# Patient Record
Sex: Female | Born: 1960 | Race: White | Hispanic: No | Marital: Married | Smoking: Current every day smoker
Health system: Southern US, Community
[De-identification: ages and names within clinical notes are randomized; demographics above are authoritative.]

## PROBLEM LIST (undated history)

## (undated) DIAGNOSIS — R011 Cardiac murmur, unspecified: Secondary | ICD-10-CM

## (undated) DIAGNOSIS — E785 Hyperlipidemia, unspecified: Secondary | ICD-10-CM

## (undated) DIAGNOSIS — Z720 Tobacco use: Secondary | ICD-10-CM

## (undated) HISTORY — DX: Tobacco use: Z72.0

## (undated) HISTORY — DX: Hyperlipidemia, unspecified: E78.5

## (undated) HISTORY — DX: Cardiac murmur, unspecified: R01.1

## (undated) HISTORY — PX: COLONOSCOPY: SHX174

---

## 2001-08-19 ENCOUNTER — Encounter: Admission: RE | Admit: 2001-08-19 | Discharge: 2001-08-19 | Payer: Self-pay | Admitting: Family Medicine

## 2001-08-19 ENCOUNTER — Encounter: Payer: Self-pay | Admitting: Family Medicine

## 2003-10-21 ENCOUNTER — Other Ambulatory Visit: Admission: RE | Admit: 2003-10-21 | Discharge: 2003-10-21 | Payer: Self-pay | Admitting: Obstetrics and Gynecology

## 2004-12-21 ENCOUNTER — Other Ambulatory Visit: Admission: RE | Admit: 2004-12-21 | Discharge: 2004-12-21 | Payer: Self-pay | Admitting: Obstetrics and Gynecology

## 2006-01-10 ENCOUNTER — Other Ambulatory Visit: Admission: RE | Admit: 2006-01-10 | Discharge: 2006-01-10 | Payer: Self-pay | Admitting: Obstetrics and Gynecology

## 2009-07-11 ENCOUNTER — Emergency Department (HOSPITAL_COMMUNITY): Admission: EM | Admit: 2009-07-11 | Discharge: 2009-07-11 | Payer: Self-pay | Admitting: Emergency Medicine

## 2010-04-07 HISTORY — PX: CARDIOVASCULAR STRESS TEST: SHX262

## 2011-03-23 ENCOUNTER — Observation Stay (HOSPITAL_COMMUNITY)
Admission: EM | Admit: 2011-03-23 | Discharge: 2011-03-24 | DRG: 251 | Disposition: A | Discharge status: Home / Self Care | Payer: BC Managed Care – PPO | Attending: Orthopedic Surgery | Admitting: Orthopedic Surgery

## 2011-03-23 DIAGNOSIS — Z7982 Long term (current) use of aspirin: Secondary | ICD-10-CM | POA: Insufficient documentation

## 2011-03-23 DIAGNOSIS — S61409A Unspecified open wound of unspecified hand, initial encounter: Secondary | ICD-10-CM | POA: Insufficient documentation

## 2011-03-23 DIAGNOSIS — Z23 Encounter for immunization: Secondary | ICD-10-CM | POA: Insufficient documentation

## 2011-03-23 DIAGNOSIS — E7800 Pure hypercholesterolemia, unspecified: Secondary | ICD-10-CM | POA: Insufficient documentation

## 2011-03-23 DIAGNOSIS — W540XXA Bitten by dog, initial encounter: Secondary | ICD-10-CM | POA: Insufficient documentation

## 2011-03-23 DIAGNOSIS — Z79899 Other long term (current) drug therapy: Secondary | ICD-10-CM | POA: Insufficient documentation

## 2011-03-23 DIAGNOSIS — S62233B Other displaced fracture of base of first metacarpal bone, unspecified hand, initial encounter for open fracture: Principal | ICD-10-CM | POA: Insufficient documentation

## 2011-03-23 DIAGNOSIS — Y92009 Unspecified place in unspecified non-institutional (private) residence as the place of occurrence of the external cause: Secondary | ICD-10-CM | POA: Insufficient documentation

## 2011-03-23 DIAGNOSIS — F17200 Nicotine dependence, unspecified, uncomplicated: Secondary | ICD-10-CM | POA: Insufficient documentation

## 2011-03-24 ENCOUNTER — Emergency Department (HOSPITAL_COMMUNITY): Payer: BC Managed Care – PPO

## 2011-03-25 NOTE — Op Note (Signed)
Angelica [name], Angelica [name]               ACCOUNT NO.:  [id number]  MEDICAL RECORD NO.:  [id number]  LOCATION:  0098                         FACILITY:  [facility]  PHYSICIAN:  Betha Loa, MD        DATE OF BIRTH:  22-Sep-1960  DATE OF PROCEDURE:  03/24/2011 DATE OF DISCHARGE:                              OPERATIVE REPORT   PREOPERATIVE DIAGNOSIS:  Bilateral hand dog bites and left thumb open metacarpal fracture.  POSTOPERATIVE DIAGNOSES:  Bilateral hand dog bites and left thumb open metacarpal fracture.  PROCEDURE:  Irrigation and debridement of bilateral hand dog bites and open left thumb metacarpal fracture.  SURGEON:  Betha Loa, MD  ASSISTANT:  None.  ANESTHESIA:  General.  IV FLUIDS:  Per anesthesia flow sheet.  ESTIMATED BLOOD LOSS:  Minimal.  COMPLICATIONS:  None.  SPECIMENS:  None.  TOURNIQUET TIME:  28 minutes on the left, 9 minutes on the right.  DISPOSITION:  Stable to PACU.  INDICATIONS:  Angelica [name] is a 50 year old right-hand-dominant female, who was bitten by her own dog yesterday evening.  She came to the [facility] emergency department where she was evaluated.  Radiographs revealed a left thumb metacarpal fracture.  This was felt to be open, was consulted for management of any injury.  On examination, she had intact sensation of capillary refill in all fingertips.  She could flex and extend the IP joint of her thumbs and cross her fingers.  She had full extension, full flexion of all digits against resistance without pain.  She had multiple bite wounds at the base of the thumb metacarpal on the left hand and bite wound between the index and long finger on the dorsum of the right hand. Radiographs showed a fracture at the base of the left thumb metacarpal that was extraarticular.  No fractures, dislocations or radiopaque foreign bodies were noted on the right hand x-rays.  I discussed the same with the nature of her injuries.  We recommend going to  the operating room for irrigation and debridement of the wounds well with packing.  Her tetanus had been updated in the emergency department.  She had been given IV Unasyn.  Risks, benefits,alternatives of surgery were discussed including the risk of blood loss, infection, damage to nerves, vessels, tendons, ligaments, bone, failure of surgery, need for additional surgery, complications with wound healing, continued pain, continued infection, need for further surgery.  She voiced understanding of these risks and elected to proceed.  OPERATIVE COURSE:  After being identified preoperatively by myself, the patient and I agreed upon procedure and site of the procedure.  The surgical site was marked.  Risks, benefits, alternatives of surgery were reviewed and she wished to proceed.  Surgical consent had been signed.  She was transferred to operating table and placed on the operating table in supine position with both upper extremities on arm boards.  General anesthesia was induced by the anesthesiologist.  The left upper extremity was prepped and draped in the normal sterile orthopedic fashion.  A surgical pause was performed between surgeons, Anesthesia, operating staff and all were in agreement of the procedure and site of procedure.  Tourniquet proximal aspect of the extremities  inflated to 250 mmHg after exsanguination of limb with an Esmarch bandage .  The wounds were explored.  They were all extended to allow visualization. There was no gross contamination.  The wound at the base of the thumb metacarpal slightly volarly communicated with the fracture site.  All wounds were copiously irrigated with sterile saline.  2000 cc of sterile saline was used to irrigate the fracture site.  1000 cc was used in the other wounds.  A couple of superficial branches of the radial nerve were visualized and were intact.  The EPL was intact.  The radial wrist extensors were intact.  There was also a wound  at the ulnar side of the wrist.  This was also irrigated and debrided.  There is no gross contamination.  There was a piece of skin within the wound.  The wounds were all packed with quarter inch Iodoform gauze.  They were then dressed with sterile Xeroform with 4x4s, and wrapped with a Kerlix. Attention was turned to the tourniquet deflated at 20 minutes. Attention was turned to the right upper extremity which had been prepped and draped in normal sterile orthopedic fashion.  Tourniquet proximal aspect of the forearm was inflated to 250 mmHg after exsanguination of limb with an Esmarch bandage.  The wound was explored.  There was noted to be no damage to the extensor tendon of either the index or long finger.  There was no gross contamination.  1000 cc of sterile saline was used to irrigate the wound.  The wound was then packed with quarter inch Iodoform gauze.  It was dressed with sterile Xeroform and 4x4s and wrapped with Kerlix and Ace bandage.  A thumb spica splint was placed on the left hand and wrapped with Kerlix and Ace bandage.  Tourniquet on the right was deflated at 9 minutes.  All fingertips were pink with brisk capillary refill after deflation of the tourniquet.  The patient was awoken from anesthesia safely.  She was transferred back to stretcher and taken to PACU in stable condition.  We will keep her in the hospital for IV antibiotics for coverage of the dog bite wounds.  We will plan fixation of the fracture at a later date.     Betha Loa, MD     KK/MEDQ  D:  03/24/2011  T:  03/24/2011  Job:  [id number]  Electronically Signed by Betha Loa  on 03/25/2011 05:09:25 PM

## 2011-03-25 NOTE — Discharge Summary (Signed)
  [name], [name]               ACCOUNT NO.:  [id number]  MEDICAL RECORD NO.:  [id number]  LOCATION:  1424                         FACILITY:  [facility]  PHYSICIAN:  Betha Loa, MD        DATE OF BIRTH:  1961/06/17  DATE OF ADMISSION:  03/23/2011 DATE OF DISCHARGE:  03/24/2011                              DISCHARGE SUMMARY   FINAL DIAGNOSES:  Bilateral hand dog bites with left thumb metacarpal fracture.  PROCEDURES:  Irrigation and debridement bilateral hand dog bites.  HISTORY:  Ms. Angelica [name] is a 50 year old right-hand-dominant female who was bitten by her own dogs on the evening of September 7.  She presented to [facility] Emergency Department where she was evaluated.  Radiographs revealed a thumb metacarpal fracture.  I was consulted for management of the injury.  On examination, she had intact sensation, capillary refill in all fingertips.  She could flex and extend the IP joints of her thumbs and cross her fingers.  FDP and FDS were intact all fingers. She had good full extension of all digits without pain.  I recommended Ms. Angelica [name] go into the operating room for irrigation and debridement of the wounds and packing of the wounds.  Risks, benefits, and alternatives of the surgery were discussed and she wished to proceed.  HOSPITAL COURSE:  Ms. Angelica [name] was taken to the operating room in the early morning hours of September 8.  Irrigation and debridement of bilateral hands was performed.  There were no complications.  She was kept for IV antibiotics.  She did well.  Pain was well-controlled.  She had no fevers, chills or night sweats.  It was thought she was safe to discharge home in the late afternoon of September 8.  She was discharged home with follow up plans for Monday for hydrotherapy and follow up with me.  I will give her Percocet 5/325 1 to 2 p.o. q.6 h p.r.n. pain, dispensed #40, and Augmentin 875 mg 1 p.o. b.i.d. x7 days.     Betha Loa, MD     KK/MEDQ  D:   03/24/2011  T:  03/24/2011  Job:  [id number]  Electronically Signed by Betha Loa  on 03/25/2011 05:09:53 PM

## 2011-03-28 NOTE — H&P (Signed)
Angelica [name]               ACCOUNT NO.:  [id number]  MEDICAL RECORD NO.:  [id number]  LOCATION:                                 FACILITY:  PHYSICIAN:  Betha Loa, MD        DATE OF BIRTH:  [birth year]  DATE OF ADMISSION:  03/24/2011 DATE OF DISCHARGE:                             HISTORY & PHYSICAL   CHIEF COMPLAINTS:  Dog bite, bilateral hands.  HISTORY OF PRESENT ILLNESS:  Angelica [name] is a 50 year old right hand dominant female who was bitten by her Rottweiler pit mixed in the late evening of September 8 when the dog started fighting with her other dog over the dog bull.  The dogs were not attacking her, but were fighting with each other.  She was bitten on both hands.  She came to [facility] Emergency Department where she was evaluated.  I was consulted for management of injury.  She reports no previous injuries to the hands and no other injuries at this time.  ALLERGIES:  No known drug allergies.  PAST MEDICAL HISTORY:  Hypercholesterolemia.  PAST SURGICAL HISTORY:  None.  MEDICATIONS:  Tricor, Lipitor, aspirin, and fish oil.  FAMILY HISTORY:  Positive for diabetes.  SOCIAL HISTORY:  Angelica [name] works at [employer].  She smokes one half-pack per day x20 plus years and drinks up to three or four beers per day.  REVIEW OF SYSTEMS:  Thirteen-point review of systems is negative.  PHYSICAL EXAMINATION:  VITAL SIGNS:  Temperature 97.6, pulse 96, respirations 18, and BP 128/82. HEAD:  Normocephalic, atraumatic. NECK:  Supple.  Full range of motion. CHEST:  Regular rate and rhythm. LUNGS:  Clear to auscultation bilaterally. ABDOMEN:  Nontender, nondistended. EXTREMITIES:  Bilateral upper extremities are intact to light touch sensation and capillary refill in all fingertips.  She can flex and extend the IP joints of her thumbs, and can cross her fingers.  In the right upper extremity, she has a wound at the dorsum of the hand between the index and long finger.  There is  a little bit of bruising in the proximal phalanx of long finger.  This is approximately 2 cm in length. She has good extension against resistance at the index, long, and ring fingers.  There is no pain when doing so.  In the left hand.  There are multiple bite wounds at the base of the thumb metacarpal and one at the ulnar side of the wrist.  She has intact sensation.  Capillary refill throughout the hand and in the palm and the hand.  FDP and FDS are intact to all digits.  She can flex and extend the IP joints of her thumb.  RADIOGRAPHS:  AP, lateral, and oblique views of left hand show an extraarticular metacarpal base fracture of the thumb.  There is some displacement.  ASSESSMENT/PLAN:  Bilateral hand dog bites and left thumb metacarpal base fracture.  I discussed with Angelica [name], the nature of her injury.  I think this is likely open fracture caused by the dog bite.  I would recommend to go into the operating room for irrigation debridement of the bite wounds.  We discussed that we would  perform fixation of the thumb metacarpal at a later day after all signs of potential infection had cleared.  We would irrigate and debride the wounds, possibly extend them and pack them open.  Risks, benefits, and alternative to the surgery were discussed including risk of blood loss; infection; damage to nerves, vessels, tendons, ligaments, and bone; failure of surgery; need for additional surgery; complications with wound healing; continued pain; continued infection; need for repeat irrigation and debridement. She voiced understanding of these risks and elected to proceed.  This will be arranged as soon as possible.     Betha Loa, MD     KK/MEDQ  D:  03/24/2011  T:  03/24/2011  Job:  [id number]  Electronically Signed by Betha Loa  on 03/28/2011 02:27:57 PM

## 2011-11-06 HISTORY — PX: TRANSTHORACIC ECHOCARDIOGRAM: SHX275

## 2012-12-31 ENCOUNTER — Other Ambulatory Visit: Payer: Self-pay | Admitting: Cardiovascular Disease

## 2012-12-31 LAB — CBC WITH DIFFERENTIAL/PLATELET
Basophils Absolute: 0 10*3/uL (ref 0.0–0.1)
Basophils Relative: 0 % (ref 0–1)
Eosinophils Absolute: 0.3 10*3/uL (ref 0.0–0.7)
Eosinophils Relative: 3 % (ref 0–5)
HCT: 44.1 % (ref 36.0–46.0)
Hemoglobin: 15.2 g/dL — ABNORMAL HIGH (ref 12.0–15.0)
Lymphocytes Relative: 45 % (ref 12–46)
Lymphs Abs: 4.5 10*3/uL — ABNORMAL HIGH (ref 0.7–4.0)
MCH: 32.5 pg (ref 26.0–34.0)
MCHC: 34.5 g/dL (ref 30.0–36.0)
MCV: 94.2 fL (ref 78.0–100.0)
Monocytes Absolute: 0.6 10*3/uL (ref 0.1–1.0)
Monocytes Relative: 6 % (ref 3–12)
Neutro Abs: 4.6 10*3/uL (ref 1.7–7.7)
Neutrophils Relative %: 46 % (ref 43–77)
Platelets: 275 10*3/uL (ref 150–400)
RBC: 4.68 MIL/uL (ref 3.87–5.11)
RDW: 13 % (ref 11.5–15.5)
WBC: 9.9 10*3/uL (ref 4.0–10.5)

## 2012-12-31 LAB — LIPID PANEL
Cholesterol: 193 mg/dL (ref 0–200)
HDL: 55 mg/dL (ref >39)
LDL Cholesterol: 84 mg/dL (ref 0–99)
Total CHOL/HDL Ratio: 3.5 Ratio
Triglycerides: 268 mg/dL — ABNORMAL HIGH (ref <150)
VLDL: 54 mg/dL — ABNORMAL HIGH (ref 0–40)

## 2012-12-31 LAB — COMPREHENSIVE METABOLIC PANEL
ALT: 21 U/L (ref 0–35)
AST: 24 U/L (ref 0–37)
Albumin: 4.3 g/dL (ref 3.5–5.2)
Alkaline Phosphatase: 69 U/L (ref 39–117)
BUN: 12 mg/dL (ref 6–23)
CO2: 23 mEq/L (ref 19–32)
Calcium: 9.4 mg/dL (ref 8.4–10.5)
Chloride: 105 mEq/L (ref 96–112)
Creat: 0.56 mg/dL (ref 0.50–1.10)
Glucose, Bld: 83 mg/dL (ref 70–99)
Potassium: 4.2 mEq/L (ref 3.5–5.3)
Sodium: 140 mEq/L (ref 135–145)
Total Bilirubin: 0.6 mg/dL (ref 0.3–1.2)
Total Protein: 6.9 g/dL (ref 6.0–8.3)

## 2012-12-31 LAB — TSH: TSH: 3.102 u[IU]/mL (ref 0.350–4.500)

## 2013-01-02 ENCOUNTER — Encounter: Payer: Self-pay | Admitting: Cardiovascular Disease

## 2013-01-19 ENCOUNTER — Telehealth: Payer: Self-pay | Admitting: *Deleted

## 2013-01-19 NOTE — Telephone Encounter (Signed)
Attempt to call lab results - no answer [phone]

## 2013-04-24 IMAGING — CR DG HAND COMPLETE 3+V*R*
3 series · 3 of 3 positions shown · non-contrast
Comparison: None.

CLINICAL DATA: Right hand injury; dog bite, with right middle
finger bruising and swelling.

RIGHT HAND - COMPLETE 3+ VIEW

[x hand pa right]
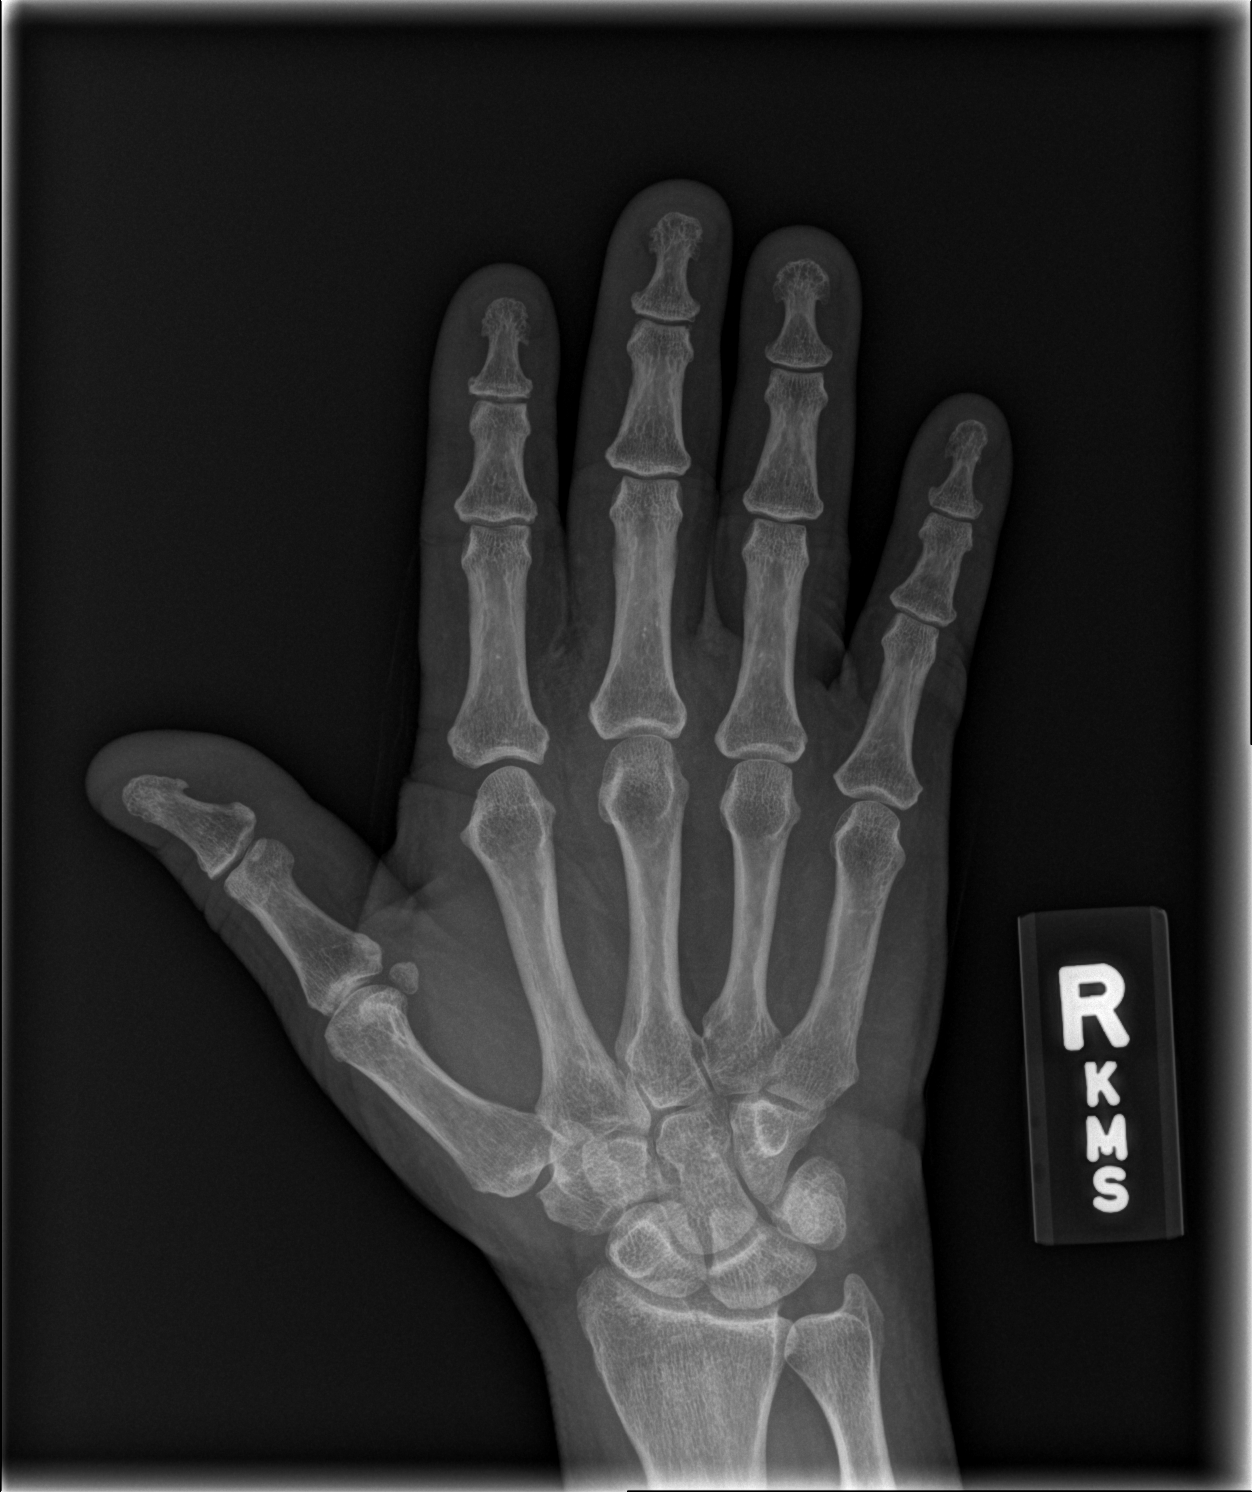

[x hand obl right]
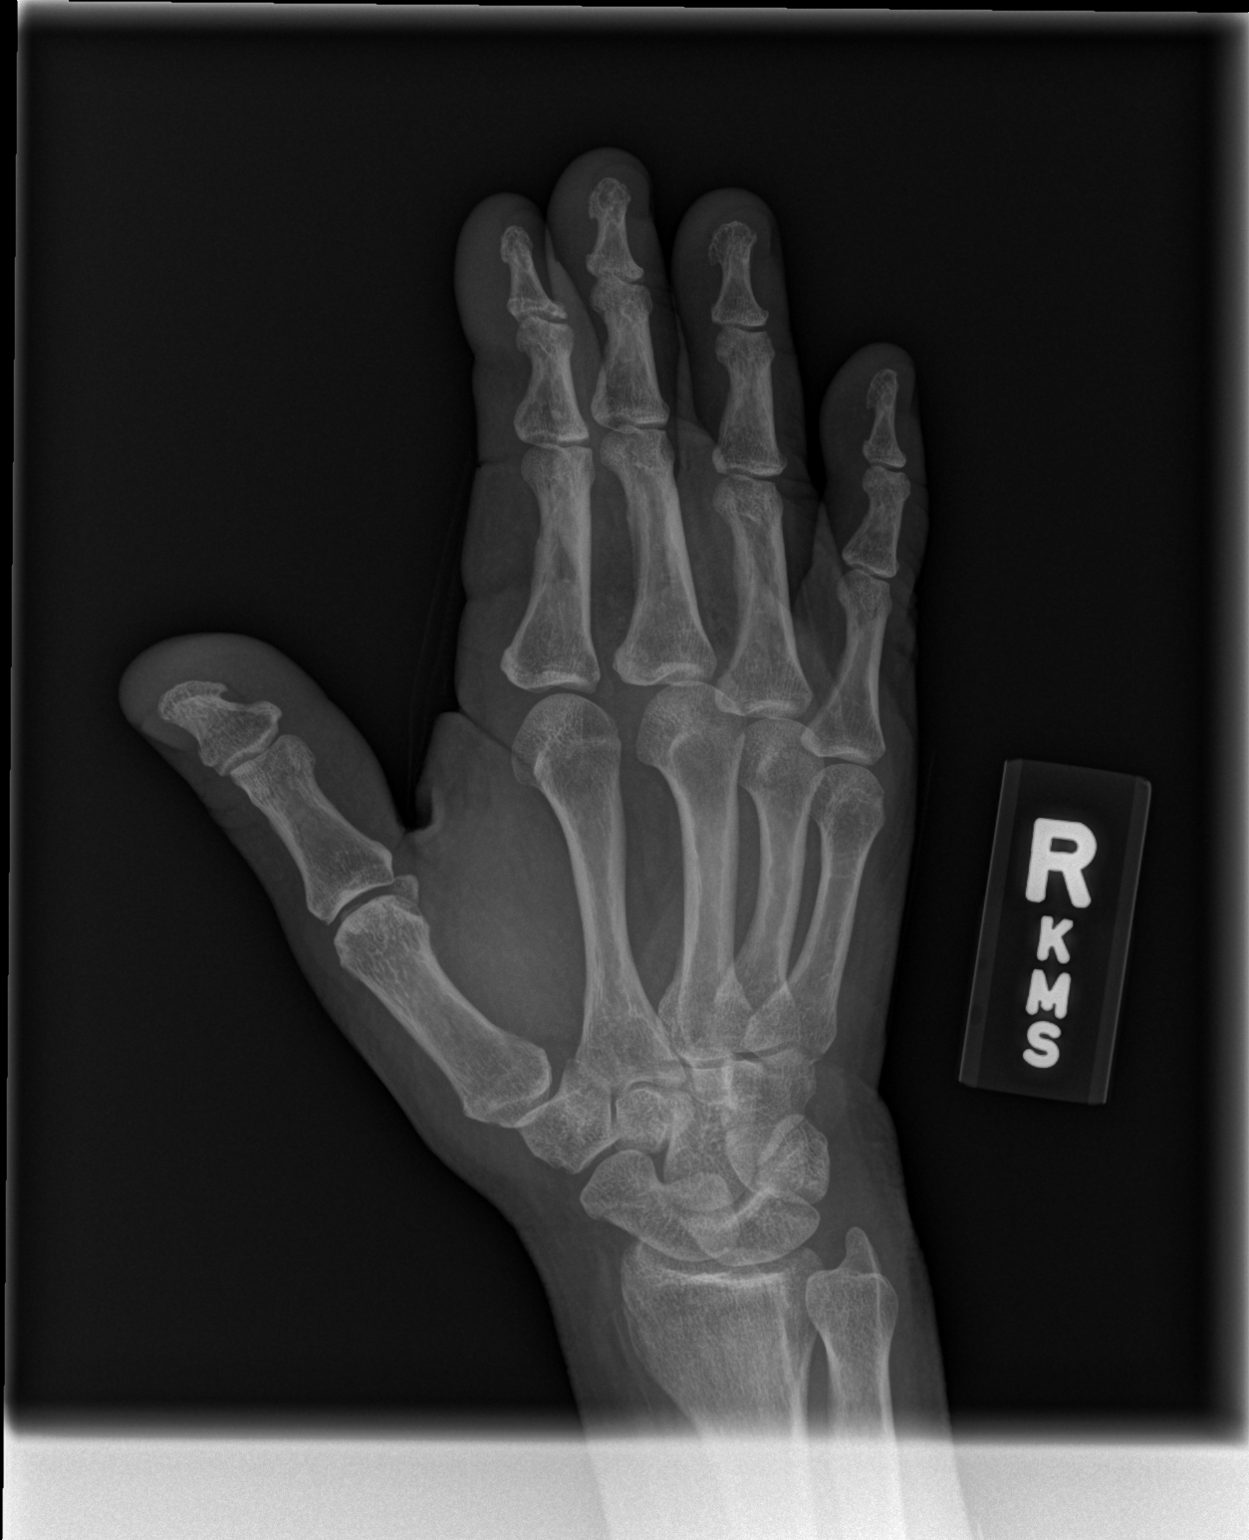

[x hand lat right]
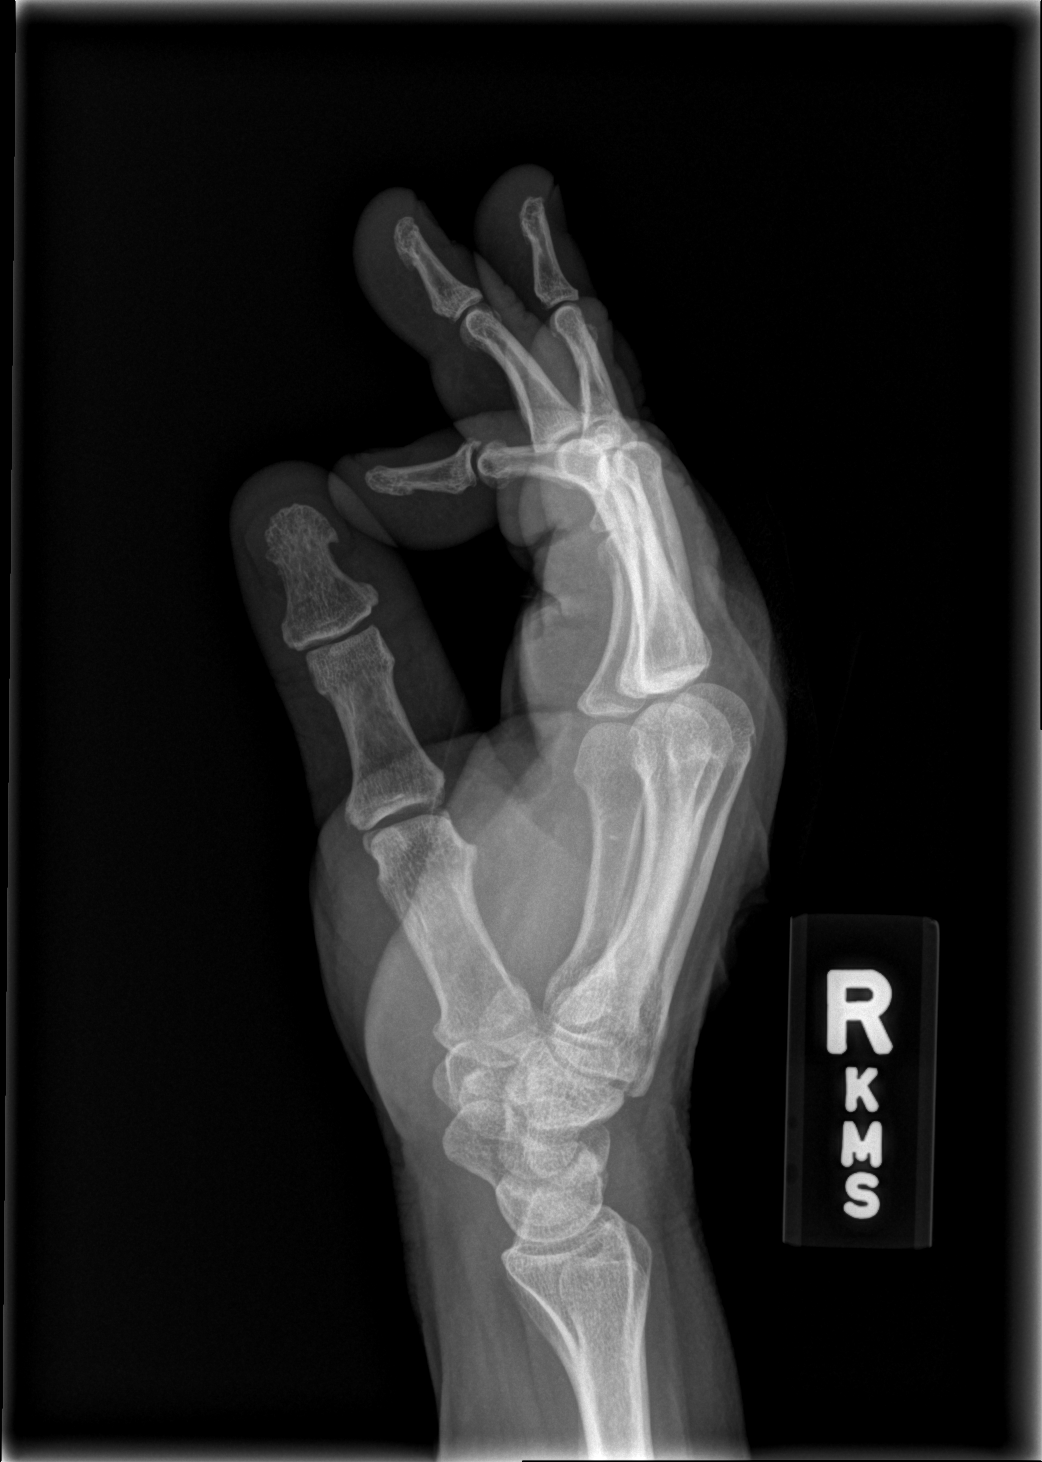

[3 of 3 positions shown; findings below may reference images not displayed]

FINDINGS: There is no evidence of fracture or dislocation.  No
definite radiopaque foreign bodies are identified.  The joint
spaces are preserved; the known soft tissue laceration is not well
characterized on radiograph.  The carpal rows are intact, and
demonstrate normal alignment.
IMPRESSION: No evidence of fracture or dislocation; no radiopaque foreign body
seen.

## 2013-05-08 ENCOUNTER — Ambulatory Visit: Payer: BC Managed Care – PPO | Admitting: Cardiovascular Disease

## 2013-05-21 ENCOUNTER — Ambulatory Visit: Payer: BC Managed Care – PPO | Admitting: Cardiovascular Disease

## 2013-06-24 ENCOUNTER — Ambulatory Visit: Payer: BC Managed Care – PPO | Admitting: Cardiovascular Disease

## 2013-07-21 ENCOUNTER — Ambulatory Visit: Payer: BC Managed Care – PPO | Admitting: Cardiovascular Disease

## 2013-08-11 ENCOUNTER — Ambulatory Visit (INDEPENDENT_AMBULATORY_CARE_PROVIDER_SITE_OTHER): Payer: BC Managed Care – PPO | Admitting: Cardiovascular Disease

## 2013-08-11 ENCOUNTER — Encounter: Payer: Self-pay | Admitting: Cardiovascular Disease

## 2013-08-11 VITALS — BP 130/68 | HR 77 | Ht 59.0 in | Wt 151.0 lb

## 2013-08-11 DIAGNOSIS — I10 Essential (primary) hypertension: Secondary | ICD-10-CM

## 2013-08-11 MED ORDER — FENOFIBRATE 48 MG PO TABS: 48.0000 mg | ORAL_TABLET | Freq: Every day | ORAL | Status: DC

## 2013-08-11 MED ORDER — ATORVASTATIN CALCIUM 20 MG PO TABS: 20.0000 mg | ORAL_TABLET | Freq: Every day | ORAL | Status: DC

## 2013-08-11 MED ORDER — METOPROLOL SUCCINATE ER 25 MG PO TB24: 25.0000 mg | ORAL_TABLET | Freq: Every day | ORAL | Status: DC

## 2013-08-11 NOTE — Progress Notes (Signed)
Patient ID: Angelica [name], female   DOB: 04/12/1961, 53 y.o.   MRN: [id number]  Chief Complaint  Patient presents with  . Follow-up    rov 6 months, RW -> JB, no complaints    HPI Angelica [name] is a 53 y.o. female who presents for routine follow up of HTN, hyperlipidemia, obesity, strong family history of coronary disease (father dying after CABG in his 3050's). She used to be followed by Dr. Alanda AmassWeintraub.  She reports feeling well. She denies any shortness of breath, chest pain, palpitations or lower extremity swelling. No dizziness or fatigue. She continues to work at [employer] where she is on her feet all day. She works 3rd shift and states that she has not had time to exercise. She continues to smoke 1/2 pack per day. She has smoked since the age of [age]yo. She is compliant with her medications.   She had a myoview stress test in September 2011 which was low risk. She has not required catheterization.   Past Medical History  Diagnosis Date  . Murmur   . Hyperlipidemia   . Tobacco use     Past Surgical History  Procedure Laterality Date  . Cardiovascular stress test  04/07/2010    EKG negative for ischemia. No significant ischemia demonstrated.  . Transthoracic echocardiogram  11/06/2011    EF >55%, normal    Family History: father with h/o CABG in his 1350's.   Social History History  Substance Use Topics  . Smoking status: Current Every Day Smoker -- 0.50 packs/day    Types: Cigarettes  . Smokeless tobacco: Not on file  . Alcohol Use: Not on file    No Known Allergies  Current Outpatient Prescriptions  Medication Sig Dispense Refill  . aspirin EC 81 MG tablet Take 81 mg by mouth daily.      Marland Kitchen. atorvastatin (LIPITOR) 20 MG tablet Take 20 mg by mouth daily.      . fenofibrate (TRICOR) 48 MG tablet Take 48 mg by mouth daily.      . fish oil-omega-3 fatty acids 1000 MG capsule Take 2 g by mouth daily.      . metoprolol succinate (TOPROL-XL) 25 MG 24 hr tablet Take 25 mg by  mouth daily.       No current facility-administered medications for this visit.    Review of Systems Review of Systems Negative except per HPI Blood pressure 130/68, pulse 77, height 4\' 11"  (1.499 m), weight 151 lb (68.493 kg).  Physical Exam Physical Exam General: no acute distress, well appearing, pleasant white female HEENT: EOMI, oropharynx clear, no JVD, no carotid bruit CV: S1S2, RRR, no murmur Pulm: clear to auscultation bilaterally, no wheezing or crackles, normal work of breathing Abdomen: soft, non tender, non distended, present bowel sounds Ext: no edema, 2+ dorsalis pedis pulse bilaterally, 2+ radial pulse bilaterally  Data Reviewed  Lipid panel: 12/2012 Chol: 193 Trig: 268 LDL: 84 HDL: 55  EKG: normal sinus rhythm, rate of 77bpm No ST changes, normal QRS, PR interval and QTc  Assessment    53 yo female with h/o HTN, hyperlipidemia, obesity, smoking and strong family history of coronary artery disease who presents for follow up.      Plan    1. Family History of coronary disease: normal stress test in 2011. Patient is currently asymptomatic.  - strongly reviewed importance of smoking cessation given her risk factors. Patient understands the importance of it but doesn't appear completely ready to quit.  - continue  statin, ASA and metoprolol succinate - recommended routine exercise routine  2. Hyperlipidemia: at goal on last check in June.  - continue atorvastatin 20mg , fenofibrate and fish oil 3. Smoking: as above, stressed importance of quitting 4. Hypertension: well controled on metoprolol succinate 25  Follow up with Dr. Allyson Sabal in 1 year.          Marena Chancy 08/11/2013, 9:09 AM  I agree with the assessment and plan by Dr. Gwenlyn Saran . Patient with positive crepitus factors. She continues to smoke despite recommendations to the contrary. She has a strong family history of heart disease and hyperlipidemia on lipid-lowering agents. She is otherwise  asymptomatic. Her exam is benign. I will see her back in one year.  Runell Gess, M.D., FACP, [facility], [facility] [address] [place], [place]  [address]  [phone] 08/11/2013 9:43 AM

## 2013-08-11 NOTE — Patient Instructions (Signed)
Your physician recommends that you schedule a follow-up appointment in: 1 year  

## 2013-10-07 ENCOUNTER — Ambulatory Visit (INDEPENDENT_AMBULATORY_CARE_PROVIDER_SITE_OTHER): Payer: BC Managed Care – PPO | Admitting: Emergency Medicine

## 2013-10-07 VITALS — BP 120/78 | HR 109 | Temp 97.7°F | Resp 18 | Ht <= 58 in | Wt 146.4 lb

## 2013-10-07 DIAGNOSIS — A088 Other specified intestinal infections: Secondary | ICD-10-CM

## 2013-10-07 MED ORDER — ONDANSETRON 8 MG PO TBDP: 8.0000 mg | ORAL_TABLET | Freq: Three times a day (TID) | ORAL | Status: DC | PRN

## 2013-10-07 MED ORDER — LOPERAMIDE HCL 2 MG PO TABS: ORAL_TABLET | ORAL | Status: DC

## 2013-10-07 NOTE — Patient Instructions (Signed)
Diet The clear liquid diet consists of foods that are liquid or will become liquid at room temperature. Examples of foods allowed on a clear liquid diet include fruit juice, broth or bouillon, gelatin, or frozen ice pops. You should be able to see through the liquid. The purpose of this diet is to provide the necessary fluids, electrolytes (such as sodium and potassium), and energy to keep the body functioning during times when you are not able to consume a regular diet. A clear liquid diet should not be continued for long periods of time, as it is not nutritionally adequate.  A CLEAR LIQUID DIET MAY BE NEEDED:  When a sudden-onset (acute) condition occurs before or after surgery.   As the first step in oral feeding.   For fluid and electrolyte replacement in diarrheal diseases.   As a diet before certain medical tests are performed.  ADEQUACY The clear liquid diet is adequate only in ascorbic acid, according to the Recommended Dietary Allowances of the National Research Council.  CHOOSING FOODS Breads and Starches  Allowed: None are allowed.   Avoid: All are to be avoided.  Vegetables  Allowed: Strained vegetable juices.   Avoid: Any others.  Fruit  Allowed: Strained fruit juices and fruit drinks. Include 1 serving of citrus or vitamin C-enriched fruit juice daily.   Avoid: Any others.  Meat and Meat Substitutes  Allowed: None are allowed.   Avoid: All are to be avoided.  Milk Products  Allowed: None are allowed.   Avoid: All are to be avoided.  Soups and Combination Foods  Allowed: Clear bouillon, broth, or strained broth-based soups.   Avoid: Any others.  Desserts and Sweets  Allowed: Sugar, honey. High-protein gelatin. Flavored gelatin, ices, or frozen ice pops that do not contain milk.   Avoid: Any others.  Fats and Oils  Allowed: None are allowed.   Avoid: All are to be avoided.  Beverages  Allowed: Cereal  beverages, coffee (regular or decaffeinated), tea, or soda at the discretion of your health care provider.   Avoid: Any others.  Condiments  Allowed: Salt.   Avoid: Any others, including pepper.  Supplements  Allowed: Liquid nutrition beverages that you can see through.   Avoid: Any others that contain lactose or fiber. SAMPLE MEAL PLAN Breakfast  4 oz (120 mL) strained orange juice.   to 1 cup (120 to 240 mL) gelatin (plain or fortified).  1 cup (240 mL) beverage (coffee or tea).  Sugar, if desired. Midmorning Snack   cup (120 mL) gelatin (plain or fortified). Lunch  1 cup (240 mL) broth or consomm.  4 oz (120 mL) strained grapefruit juice.   cup (120 mL) gelatin (plain or fortified).  1 cup (240 mL) beverage (coffee or tea).  Sugar, if desired. Midafternoon Snack   cup (120 mL) fruit ice.   cup (120 mL) strained fruit juice. Dinner  1 cup (240 mL) broth or consomm.   cup (120 mL) cranberry juice.   cup (120 mL) flavored gelatin (plain or fortified).  1 cup (240 mL) beverage (coffee or tea).  Sugar, if desired. Evening Snack  4 oz (120 mL) strained apple juice (vitamin C-fortified).   cup (120 mL) flavored gelatin (plain or fortified). MAKE SURE YOU:  Understand these instructions.  Will watch your child's condition.  Will get help right away if your child is not doing well or gets worse. Document Released: 07/02/2005 Document Revised: 03/04/2013 Document Reviewed: 12/02/2012 ExitCare Patient Information 2014 [employer]. Viral   Gastroenteritis Viral gastroenteritis is also known as stomach flu. This condition affects the stomach and intestinal tract. It can cause sudden diarrhea and vomiting. The illness typically lasts 3 to 8 days. Most people develop an immune response that eventually gets rid of the virus. While this natural response develops, the virus can make you quite ill. CAUSES  Many different viruses can cause  gastroenteritis, such as rotavirus or noroviruses. You can catch one of these viruses by consuming contaminated food or water. You may also catch a virus by sharing utensils or other personal items with an infected person or by touching a contaminated surface. SYMPTOMS  The most common symptoms are diarrhea and vomiting. These problems can cause a severe loss of body fluids (dehydration) and a body salt (electrolyte) imbalance. Other symptoms may include:  Fever.  Headache.  Fatigue.  Abdominal pain. DIAGNOSIS  Your caregiver can usually diagnose viral gastroenteritis based on your symptoms and a physical exam. A stool sample may also be taken to test for the presence of viruses or other infections. TREATMENT  This illness typically goes away on its own. Treatments are aimed at rehydration. The most serious cases of viral gastroenteritis involve vomiting so severely that you are not able to keep fluids down. In these cases, fluids must be given through an intravenous line (IV). HOME CARE INSTRUCTIONS   Drink enough fluids to keep your urine clear or pale yellow. Drink small amounts of fluids frequently and increase the amounts as tolerated.  Ask your caregiver for specific rehydration instructions.  Avoid:  Foods high in sugar.  Alcohol.  Carbonated drinks.  Tobacco.  Juice.  Caffeine drinks.  Extremely hot or cold fluids.  Fatty, greasy foods.  Too much intake of anything at one time.  Dairy products until 24 to 48 hours after diarrhea stops.  You may consume probiotics. Probiotics are active cultures of beneficial bacteria. They may lessen the amount and number of diarrheal stools in adults. Probiotics can be found in yogurt with active cultures and in supplements.  Wash your hands well to avoid spreading the virus.  Only take over-the-counter or prescription medicines for pain, discomfort, or fever as directed by your caregiver. Do not give aspirin to children.  Antidiarrheal medicines are not recommended.  Ask your caregiver if you should continue to take your regular prescribed and over-the-counter medicines.  Keep all follow-up appointments as directed by your caregiver. SEEK IMMEDIATE MEDICAL CARE IF:   You are unable to keep fluids down.  You do not urinate at least once every 6 to 8 hours.  You develop shortness of breath.  You notice blood in your stool or vomit. This may look like coffee grounds.  You have abdominal pain that increases or is concentrated in one small area (localized).  You have persistent vomiting or diarrhea.  You have a fever.  The patient is a child younger than 3 months, and he or she has a fever.  The patient is a child older than 3 months, and he or she has a fever and persistent symptoms.  The patient is a child older than 3 months, and he or she has a fever and symptoms suddenly get worse.  The patient is a baby, and he or she has no tears when crying. MAKE SURE YOU:   Understand these instructions.  Will watch your condition.  Will get help right away if you are not doing well or get worse. Document Released: 07/02/2005 Document Revised: 09/24/2011 Document Reviewed: 04/18/2011   ExitCare Patient Information 2014 [employer].  

## 2013-10-07 NOTE — Progress Notes (Signed)
[facility] [address], [place] [id number] [phone]  Date:  10/07/2013   Name:  Angelica [name]   DOB:  1960/12/19   MRN:  [id number]  PCP:  No PCP Per Patient    Chief Complaint: Emesis, Diarrhea, Headache and Chills   History of Present Illness:  Angelica [name] is a 53 y.o. very pleasant female patient who presents with the following:  Ill with nausea and vomiting since early Monday.  Has watery diarrhea.  The patient has no complaint of blood, mucous, or pus in her stools. No fever or chills.  Has been treating herself vigorously with OTC medications for influenza with no improvement.  Denies other complaint or health concern today.   There are no active problems to display for this patient.   Past Medical History  Diagnosis Date  . Murmur   . Hyperlipidemia   . Tobacco use     Past Surgical History  Procedure Laterality Date  . Cardiovascular stress test  04/07/2010    EKG negative for ischemia. No significant ischemia demonstrated.  . Transthoracic echocardiogram  11/06/2011    EF >55%, normal  . Colonoscopy      History  Substance Use Topics  . Smoking status: Current Every Day Smoker -- 0.50 packs/day    Types: Cigarettes  . Smokeless tobacco: Not on file  . Alcohol Use: Not on file    Family History  Problem Relation Age of Onset  . Heart disease Father     No Known Allergies  Medication list has been reviewed and updated.  Current Outpatient Prescriptions on File Prior to Visit  Medication Sig Dispense Refill  . aspirin EC 81 MG tablet Take 81 mg by mouth daily.      Marland Kitchen. atorvastatin (LIPITOR) 20 MG tablet Take 1 tablet (20 mg total) by mouth daily.  30 tablet  11  . fenofibrate (TRICOR) 48 MG tablet Take 1 tablet (48 mg total) by mouth daily.  30 tablet  11  . fish oil-omega-3 fatty acids 1000 MG capsule Take 2 g by mouth daily.      . metoprolol succinate (TOPROL-XL) 25 MG 24 hr tablet Take 1 tablet (25 mg total) by  mouth daily.  30 tablet  11   No current facility-administered medications on file prior to visit.    Review of Systems:  As per HPI, otherwise negative.    Physical Examination: Filed Vitals:   10/07/13 0830  BP: 120/78  Pulse: 109  Temp: 97.7 F (36.5 C)  Resp: 18   Filed Vitals:   10/07/13 0830  Height: 4\' 10"  (1.473 m)  Weight: 146 lb 6.4 oz (66.407 kg)   Body mass index is 30.61 kg/(m^2). Ideal Body Weight: Weight in (lb) to have BMI = 25: 119.4  GEN: WDWN, NAD, Non-toxic, A & O x 3  Dry HEENT: Atraumatic, Normocephalic. Neck supple. No masses, No LAD. Ears and Nose: No external deformity. CV: RRR, No M/G/R. No JVD. No thrill. No extra heart sounds. PULM: CTA B, no wheezes, crackles, rhonchi. No retractions. No resp. distress. No accessory muscle use. ABD: S, NT, ND, +BS. No rebound. No HSM. EXTR: No c/c/e NEURO Normal gait.  PSYCH: Normally interactive. Conversant. Not depressed or anxious appearing.  Calm demeanor.    Assessment and Plan: Gastroenteritis Imodium  zofran Clears  Signed,  Phillips OdorJeffery Arrin Ishler, MD

## 2014-01-17 ENCOUNTER — Ambulatory Visit (INDEPENDENT_AMBULATORY_CARE_PROVIDER_SITE_OTHER): Payer: BC Managed Care – PPO | Admitting: Family Medicine

## 2014-01-17 VITALS — BP 106/60 | HR 88 | Temp 99.7°F | Resp 20 | Ht 58.5 in | Wt 150.0 lb

## 2014-01-17 DIAGNOSIS — R0602 Shortness of breath: Secondary | ICD-10-CM

## 2014-01-17 DIAGNOSIS — F17200 Nicotine dependence, unspecified, uncomplicated: Secondary | ICD-10-CM

## 2014-01-17 DIAGNOSIS — R05 Cough: Secondary | ICD-10-CM

## 2014-01-17 DIAGNOSIS — J029 Acute pharyngitis, unspecified: Secondary | ICD-10-CM

## 2014-01-17 DIAGNOSIS — J209 Acute bronchitis, unspecified: Secondary | ICD-10-CM

## 2014-01-17 DIAGNOSIS — R059 Cough, unspecified: Secondary | ICD-10-CM

## 2014-01-17 LAB — POCT RAPID STREP A (OFFICE): Rapid Strep A Screen: NEGATIVE

## 2014-01-17 MED ORDER — PREDNISONE 20 MG PO TABS: 20.0000 mg | ORAL_TABLET | Freq: Every day | ORAL | Status: DC

## 2014-01-17 MED ORDER — AZITHROMYCIN 250 MG PO TABS: ORAL_TABLET | ORAL | Status: DC

## 2014-01-17 MED ORDER — ALBUTEROL SULFATE (2.5 MG/3ML) 0.083% IN NEBU
2.5000 mg | INHALATION_SOLUTION | Freq: Once | RESPIRATORY_TRACT | Status: AC
  Administered 2014-01-17: 2.5 mg via RESPIRATORY_TRACT

## 2014-01-17 MED ORDER — IPRATROPIUM-ALBUTEROL 20-100 MCG/ACT IN AERS: 1.0000 | INHALATION_SPRAY | Freq: Four times a day (QID) | RESPIRATORY_TRACT | Status: DC

## 2014-01-17 MED ORDER — HYDROCOD POLST-CHLORPHEN POLST 10-8 MG/5ML PO LQCR: 5.0000 mL | Freq: Two times a day (BID) | ORAL | Status: DC | PRN

## 2014-01-17 MED ORDER — IPRATROPIUM BROMIDE 0.02 % IN SOLN
0.5000 mg | Freq: Once | RESPIRATORY_TRACT | Status: AC
  Administered 2014-01-17: 0.5 mg via RESPIRATORY_TRACT

## 2014-01-17 NOTE — Progress Notes (Signed)
Subjective:    Patient ID: Angelica [name], female    DOB: 13-Nov-1960, 53 y.o.   MRN: [id number]  HPI  Developed illness 5d prev.  Has been using a variety of otc meds and going to work but sxs have progressively worsened.  Course has waxing/waning. She has had subj f/c, along HA frontal and diffuse, no ear sxs, rhinorrhea with clear mucous, no sinus pressure but severe pharyngitis with cough prod of brown phlegm. Has had some nausea and decreased appetitie but no vomiting, normal bowels and GU. No abd pain.  Sxs are keeping her from sleep - esp cough and sore throat.  Nyquil, generic ES cough med, cepachol lozenges.  No SHoB or CP, no wheezing, but is having bilateral lateral rib/flank pain with coughing. Smoker < 1/2 ppd but stopped for past sev d. Works 3rd shift at [employer] Sister is ill with similar sxs and they were on vacation last wk.  Past Medical History  Diagnosis Date  . Murmur   . Hyperlipidemia   . Tobacco use    Current Outpatient Prescriptions on File Prior to Visit  Medication Sig Dispense Refill  . aspirin EC 81 MG tablet Take 81 mg by mouth daily.      Marland Kitchen. atorvastatin (LIPITOR) 20 MG tablet Take 1 tablet (20 mg total) by mouth daily.  30 tablet  11  . fenofibrate (TRICOR) 48 MG tablet Take 1 tablet (48 mg total) by mouth daily.  30 tablet  11  . fish oil-omega-3 fatty acids 1000 MG capsule Take 2 g by mouth daily.      . metoprolol succinate (TOPROL-XL) 25 MG 24 hr tablet Take 1 tablet (25 mg total) by mouth daily.  30 tablet  11   No current facility-administered medications on file prior to visit.   No Known Allergies  Review of Systems  Constitutional: Positive for fever, chills, diaphoresis, activity change, appetite change and fatigue. Negative for unexpected weight change.  HENT: Positive for congestion, postnasal drip, rhinorrhea, sinus pressure and sore throat. Negative for ear discharge, ear pain, mouth sores, nosebleeds, sneezing, trouble swallowing and  voice change.   Eyes: Negative for pain.  Respiratory: Positive for cough and chest tightness. Negative for shortness of breath and wheezing.   Cardiovascular: Positive for chest pain. Negative for palpitations.  Gastrointestinal: Positive for nausea. Negative for vomiting, abdominal pain, diarrhea and constipation.  Genitourinary: Negative for dysuria, frequency and decreased urine volume.  Musculoskeletal: Negative for gait problem, myalgias, neck pain and neck stiffness.  Neurological: Positive for headaches. Negative for dizziness and syncope.  Hematological: Negative for adenopathy.  Psychiatric/Behavioral: Positive for sleep disturbance.      BP 106/60  Pulse 107  Temp(Src) 99.7 F (37.6 C) (Oral)  Resp 20  Ht 4' 10.5" (1.486 m)  Wt 150 lb (68.04 kg)  BMI 30.81 kg/m2  SpO2 94% Objective:   Physical Exam  Constitutional: She is oriented to person, place, and time. She appears well-developed and well-nourished. No distress.  HENT:  Head: Normocephalic and atraumatic.  Right Ear: Tympanic membrane and external ear normal.  Left Ear: Tympanic membrane and external ear normal.  Nose: Nose normal. No mucosal edema or rhinorrhea.  Mouth/Throat: Uvula is midline. Mucous membranes are not pale and dry. No uvula swelling. Posterior oropharyngeal edema and posterior oropharyngeal erythema present. No oropharyngeal exudate.  bilatearl ear canals w/ mod cerumen and white waxy skin flaking  Eyes: Conjunctivae are normal. Right eye exhibits no discharge. Left eye exhibits no  discharge. No scleral icterus.  Neck: Normal range of motion. Neck supple.  Cardiovascular: Normal rate, regular rhythm, normal heart sounds and intact distal pulses.   Pulmonary/Chest: Effort normal. She has decreased breath sounds. She has no wheezes. She has no rhonchi. She has no rales.  Very short exp phase Peak flow 160, after duoneb 200 o2 sat increased to 95% after dueneb  Lymphadenopathy:    She has no  cervical adenopathy.  Neurological: She is alert and oriented to person, place, and time.  Skin: Skin is warm and dry. She is not diaphoretic. No erythema.  Psychiatric: She has a normal mood and affect. Her behavior is normal.      Results for orders placed in visit on 01/17/14  POCT RAPID STREP A (OFFICE)      Result Value Ref Range   Rapid Strep A Screen Negative  Negative    Assessment & Plan:   Shortness of breath - Plan: albuterol (PROVENTIL) (2.5 MG/3ML) 0.083% nebulizer solution 2.5 mg, ipratropium (ATROVENT) nebulizer solution 0.5 mg  Cough - Plan: albuterol (PROVENTIL) (2.5 MG/3ML) 0.083% nebulizer solution 2.5 mg, ipratropium (ATROVENT) nebulizer solution 0.5 mg, Throat culture ([facility])  Acute pharyngitis, unspecified pharyngitis type - Plan: POCT rapid strep A, ipratropium (ATROVENT) nebulizer solution 0.5 mg, Throat culture ([facility])  Tobacco use disorder - pt would like to quit, is going to try on own but would like to discuss meds prn at f/u.  Acute bronchitis, unspecified organism   Suspect pt has underlying COPE due to poor peak flow and low O2 sat - exam SIG improved after duoneb in office.  Cover w/ prednisone, combivent (w/ spacer, ask pharmacist how to use), and zpack.  RTC after sxs resolve for spirometry and CXR to see if pt has COPD and discuss smoking cessation. If not sig improved in 3-4d, RTC for further eval w/ cbc and cxr.  Meds ordered this encounter  Medications  . albuterol (PROVENTIL) (2.5 MG/3ML) 0.083% nebulizer solution 2.5 mg    Sig:   . ipratropium (ATROVENT) nebulizer solution 0.5 mg    Sig:   . chlorpheniramine-HYDROcodone (TUSSIONEX PENNKINETIC ER) 10-8 MG/5ML LQCR    Sig: Take 5 mLs by mouth every 12 (twelve) hours as needed.    Dispense:  120 mL    Refill:  0  . predniSONE (DELTASONE) 20 MG tablet    Sig: Take 1 tablet (20 mg total) by mouth daily with breakfast. 3 tabs po qd x 2d, 2 tabs po qd x 2d, 1 tab po qd x 2d    Dispense:  12  tablet    Refill:  0  . azithromycin (ZITHROMAX) 250 MG tablet    Sig: Take 2 tabs PO x 1 dose, then 1 tab PO QD x 4 days    Dispense:  6 tablet    Refill:  0  . Ipratropium-Albuterol (COMBIVENT) 20-100 MCG/ACT AERS respimat    Sig: Inhale 1 puff into the lungs every 6 (six) hours.    Dispense:  4 g    Refill:  2    I personally performed the services described in this documentation, which was scribed in my presence. The recorded information has been reviewed and considered, and addended by me as needed.  Norberto SorensonEva Belem Hintze, MD MPH

## 2014-01-17 NOTE — Patient Instructions (Signed)
I suspect you have developed COPD from how poor your lungs are working.  That is why we are putting you on prednisone in addition to the antibiotic - to open up your lungs and get you breathing better during this illness.  When you get better, please come back to clinic so we can do more in-depth lung testing and see if you improve from inhalers and how we can help you quit smoking so that you don't need to go on oxygen in a few years.  Acute Bronchitis Bronchitis is inflammation of the airways that extend from the windpipe into the lungs (bronchi). The inflammation often causes mucus to develop. This leads to a cough, which is the most common symptom of bronchitis.  In acute bronchitis, the condition usually develops suddenly and goes away over time, usually in a couple weeks. Smoking, allergies, and asthma can make bronchitis worse. Repeated episodes of bronchitis may cause further lung problems.  CAUSES Acute bronchitis is most often caused by the same virus that causes a cold. The virus can spread from person to person (contagious).  SIGNS AND SYMPTOMS   Cough.   Fever.   Coughing up mucus.   Body aches.   Chest congestion.   Chills.   Shortness of breath.   Sore throat.  DIAGNOSIS  Acute bronchitis is usually diagnosed through a physical exam. Tests, such as chest X-rays, are sometimes done to rule out other conditions.  TREATMENT  Acute bronchitis usually goes away in a couple weeks. Often times, no medical treatment is necessary. Medicines are sometimes given for relief of fever or cough. Antibiotics are usually not needed but may be prescribed in certain situations. In some cases, an inhaler may be recommended to help reduce shortness of breath and control the cough. A cool mist vaporizer may also be used to help thin bronchial secretions and make it easier to clear the chest.  HOME CARE INSTRUCTIONS  Get plenty of rest.   Drink enough fluids to keep your urine clear  or pale yellow (unless you have a medical condition that requires fluid restriction). Increasing fluids may help thin your secretions and will prevent dehydration.   Only take over-the-counter or prescription medicines as directed by your health care provider.   Avoid smoking and secondhand smoke. Exposure to cigarette smoke or irritating chemicals will make bronchitis worse. If you are a smoker, consider using nicotine gum or skin patches to help control withdrawal symptoms. Quitting smoking will help your lungs heal faster.   Reduce the chances of another bout of acute bronchitis by washing your hands frequently, avoiding people with cold symptoms, and trying not to touch your hands to your mouth, nose, or eyes.   Follow up with your health care provider as directed.  SEEK MEDICAL CARE IF: Your symptoms do not improve after 1 week of treatment.  SEEK IMMEDIATE MEDICAL CARE IF:  You develop an increased fever or chills.   You have chest pain.   You have severe shortness of breath.  You have bloody sputum.   You develop dehydration.  You develop fainting.  You develop repeated vomiting.  You develop a severe headache. MAKE SURE YOU:   Understand these instructions.  Will watch your condition.  Will get help right away if you are not doing well or get worse. Document Released: 08/09/2004 Document Revised: 03/04/2013 Document Reviewed: 12/23/2012 [facility] Patient Information 2015 [place], [place]. This information is not intended to replace advice given to you by your health care  provider. Make sure you discuss any questions you have with your health care provider. Bronchitis Bronchitis is inflammation of the airways that extend from the windpipe into the lungs (bronchi). The inflammation often causes mucus to develop, which leads to a cough. If the inflammation becomes severe, it may cause shortness of breath. CAUSES  Bronchitis may be caused by:   Viral infections.    Bacteria.   Cigarette smoke.   Allergens, pollutants, and other irritants.  SIGNS AND SYMPTOMS  The most common symptom of bronchitis is a frequent cough that produces mucus. Other symptoms include:  Fever.   Body aches.   Chest congestion.   Chills.   Shortness of breath.   Sore throat.  DIAGNOSIS  Bronchitis is usually diagnosed through a medical history and physical exam. Tests, such as chest X-rays, are sometimes done to rule out other conditions.  TREATMENT  You may need to avoid contact with whatever caused the problem (smoking, for example). Medicines are sometimes needed. These may include:  Antibiotics. These may be prescribed if the condition is caused by bacteria.  Cough suppressants. These may be prescribed for relief of cough symptoms.   Inhaled medicines. These may be prescribed to help open your airways and make it easier for you to breathe.   Steroid medicines. These may be prescribed for those with recurrent (chronic) bronchitis. HOME CARE INSTRUCTIONS  Get plenty of rest.   Drink enough fluids to keep your urine clear or pale yellow (unless you have a medical condition that requires fluid restriction). Increasing fluids may help thin your secretions and will prevent dehydration.   Only take over-the-counter or prescription medicines as directed by your health care provider.  Only take antibiotics as directed. Make sure you finish them even if you start to feel better.  Avoid secondhand smoke, irritating chemicals, and strong fumes. These will make bronchitis worse. If you are a smoker, quit smoking. Consider using nicotine gum or skin patches to help control withdrawal symptoms. Quitting smoking will help your lungs heal faster.   Put a cool-mist humidifier in your bedroom at night to moisten the air. This may help loosen mucus. Change the water in the humidifier daily. You can also run the hot water in your shower and sit in the  bathroom with the door closed for 5-10 minutes.   Follow up with your health care provider as directed.   Wash your hands frequently to avoid catching bronchitis again or spreading an infection to others.  SEEK MEDICAL CARE IF: Your symptoms do not improve after 1 week of treatment.  SEEK IMMEDIATE MEDICAL CARE IF:  Your fever increases.  You have chills.   You have chest pain.   You have worsening shortness of breath.   You have bloody sputum.  You faint.  You have lightheadedness.  You have a severe headache.   You vomit repeatedly. MAKE SURE YOU:   Understand these instructions.  Will watch your condition.  Will get help right away if you are not doing well or get worse. Document Released: 07/02/2005 Document Revised: 04/22/2013 Document Reviewed: 02/24/2013 [facility] Patient Information 2015 [place], [place]. This information is not intended to replace advice given to you by your health care provider. Make sure you discuss any questions you have with your health care provider. Smoking Cessation Quitting smoking is important to your health and has many advantages. However, it is not always easy to quit since nicotine is a very addictive drug. Often times, people try 3 times  or more before being able to quit. This document explains the best ways for you to prepare to quit smoking. Quitting takes hard work and a lot of effort, but you can do it. ADVANTAGES OF QUITTING SMOKING  You will live longer, feel better, and live better.  Your body will feel the impact of quitting smoking almost immediately.  Within 20 minutes, blood pressure decreases. Your pulse returns to its normal level.  After 8 hours, carbon monoxide levels in the blood return to normal. Your oxygen level increases.  After 24 hours, the chance of having a heart attack starts to decrease. Your breath, hair, and body stop smelling like smoke.  After 48 hours, damaged nerve endings begin to recover.  Your sense of taste and smell improve.  After 72 hours, the body is virtually free of nicotine. Your bronchial tubes relax and breathing becomes easier.  After 2 to 12 weeks, lungs can hold more air. Exercise becomes easier and circulation improves.  The risk of having a heart attack, stroke, cancer, or lung disease is greatly reduced.  After 1 year, the risk of coronary heart disease is cut in half.  After 5 years, the risk of stroke falls to the same as a nonsmoker.  After 10 years, the risk of lung cancer is cut in half and the risk of other cancers decreases significantly.  After 15 years, the risk of coronary heart disease drops, usually to the level of a nonsmoker.  If you are pregnant, quitting smoking will improve your chances of having a healthy baby.  The people you live with, especially any children, will be healthier.  You will have extra money to spend on things other than cigarettes. QUESTIONS TO THINK ABOUT BEFORE ATTEMPTING TO QUIT You may want to talk about your answers with your caregiver.  Why do you want to quit?  If you tried to quit in the past, what helped and what did not?  What will be the most difficult situations for you after you quit? How will you plan to handle them?  Who can help you through the tough times? Your family? Friends? A caregiver?  What pleasures do you get from smoking? What ways can you still get pleasure if you quit? Here are some questions to ask your caregiver:  How can you help me to be successful at quitting?  What medicine do you think would be best for me and how should I take it?  What should I do if I need more help?  What is smoking withdrawal like? How can I get information on withdrawal? GET READY  Set a quit date.  Change your environment by getting rid of all cigarettes, ashtrays, matches, and lighters in your home, car, or work. Do not let people smoke in your home.  Review your past attempts to quit. Think  about what worked and what did not. GET SUPPORT AND ENCOURAGEMENT You have a better chance of being successful if you have help. You can get support in many ways.  Tell your family, friends, and co-workers that you are going to quit and need their support. Ask them not to smoke around you.  Get individual, group, or telephone counseling and support. Programs are available at [employer] and health centers. Call your local health department for information about programs in your area.  Spiritual beliefs and practices may help some smokers quit.  Download a "quit meter" on your computer to keep track of quit statistics, such as how  long you have gone without smoking, cigarettes not smoked, and money saved.  Get a self-help book about quitting smoking and staying off of tobacco. LEARN NEW SKILLS AND BEHAVIORS  Distract yourself from urges to smoke. Talk to someone, go for a walk, or occupy your time with a task.  Change your normal routine. Take a different route to work. Drink tea instead of coffee. Eat breakfast in a different place.  Reduce your stress. Take a hot bath, exercise, or read a book.  Plan something enjoyable to do every day. Reward yourself for not smoking.  Explore interactive web-based programs that specialize in helping you quit. GET MEDICINE AND USE IT CORRECTLY Medicines can help you stop smoking and decrease the urge to smoke. Combining medicine with the above behavioral methods and support can greatly increase your chances of successfully quitting smoking.  Nicotine replacement therapy helps deliver nicotine to your body without the negative effects and risks of smoking. Nicotine replacement therapy includes nicotine gum, lozenges, inhalers, nasal sprays, and skin patches. Some may be available over-the-counter and others require a prescription.  Antidepressant medicine helps people abstain from smoking, but how this works is unknown. This medicine is available by  prescription.  Nicotinic receptor partial agonist medicine simulates the effect of nicotine in your brain. This medicine is available by prescription. Ask your caregiver for advice about which medicines to use and how to use them based on your health history. Your caregiver will tell you what side effects to look out for if you choose to be on a medicine or therapy. Carefully read the information on the package. Do not use any other product containing nicotine while using a nicotine replacement product.  RELAPSE OR DIFFICULT SITUATIONS Most relapses occur within the first 3 months after quitting. Do not be discouraged if you start smoking again. Remember, most people try several times before finally quitting. You may have symptoms of withdrawal because your body is used to nicotine. You may crave cigarettes, be irritable, feel very hungry, cough often, get headaches, or have difficulty concentrating. The withdrawal symptoms are only temporary. They are strongest when you first quit, but they will go away within 10-14 days. To reduce the chances of relapse, try to:  Avoid drinking alcohol. Drinking lowers your chances of successfully quitting.  Reduce the amount of caffeine you consume. Once you quit smoking, the amount of caffeine in your body increases and can give you symptoms, such as a rapid heartbeat, sweating, and anxiety.  Avoid smokers because they can make you want to smoke.  Do not let weight gain distract you. Many smokers will gain weight when they quit, usually less than 10 pounds. Eat a healthy diet and stay active. You can always lose the weight gained after you quit.  Find ways to improve your mood other than smoking. FOR MORE INFORMATION  www.smokefree.gov  Document Released: 06/26/2001 Document Revised: 01/01/2012 Document Reviewed: 10/11/2011 [facility] Patient Information 2015 [place], [place]. This information is not intended to replace advice given to you by your health care  provider. Make sure you discuss any questions you have with your health care provider.

## 2014-01-18 ENCOUNTER — Telehealth: Payer: Self-pay | Admitting: Family Medicine

## 2014-01-18 NOTE — Telephone Encounter (Signed)
Message copied by Tilman NeatPETTY, Jaycen Vercher L on Mon Jan 18, 2014 12:07 PM ------      Message from: FerridaySHAW, EVA      Created: Sun Jan 17, 2014 10:35 AM       See if pt wants to sched f/u for CXR and spirometry after acute illness resolved - 1 mo ------

## 2014-01-18 NOTE — Telephone Encounter (Signed)
LMOM to CB. 

## 2014-01-19 LAB — CULTURE, GROUP A STREP: Organism ID, Bacteria: NORMAL

## 2014-01-22 NOTE — Progress Notes (Signed)
Left a message for patient to return call to schedule a follow up appointment in one month with Dr.shaw

## 2014-01-27 NOTE — Telephone Encounter (Signed)
Left detailed message on vm to call if f/u needed

## 2014-08-10 ENCOUNTER — Telehealth: Payer: Self-pay | Admitting: Cardiovascular Disease

## 2014-08-15 ENCOUNTER — Other Ambulatory Visit: Payer: Self-pay | Admitting: Family Medicine

## 2014-08-15 ENCOUNTER — Other Ambulatory Visit: Payer: Self-pay | Admitting: Cardiovascular Disease

## 2014-08-16 NOTE — Telephone Encounter (Signed)
Rx refill sent to patient pharmacy   

## 2014-08-25 NOTE — Telephone Encounter (Signed)
Closed encounter °

## 2014-08-26 ENCOUNTER — Other Ambulatory Visit: Payer: Self-pay | Admitting: Cardiovascular Disease

## 2014-08-27 NOTE — Telephone Encounter (Signed)
Rx(s) sent to pharmacy electronically. OV 10/19/14 

## 2014-08-31 ENCOUNTER — Ambulatory Visit: Payer: BC Managed Care – PPO | Admitting: Cardiovascular Disease

## 2014-09-30 ENCOUNTER — Telehealth: Payer: Self-pay | Admitting: Cardiovascular Disease

## 2014-10-04 NOTE — Telephone Encounter (Signed)
Closed encounter °

## 2014-10-18 ENCOUNTER — Other Ambulatory Visit: Payer: Self-pay | Admitting: Cardiovascular Disease

## 2014-10-18 NOTE — Telephone Encounter (Signed)
Rx(s) sent to pharmacy electronically.  

## 2014-10-19 ENCOUNTER — Ambulatory Visit: Payer: Self-pay | Admitting: Cardiovascular Disease

## 2014-10-20 ENCOUNTER — Other Ambulatory Visit: Payer: Self-pay

## 2014-10-20 MED ORDER — METOPROLOL SUCCINATE ER 25 MG PO TB24: 25.0000 mg | ORAL_TABLET | Freq: Every day | ORAL | Status: DC

## 2014-10-20 NOTE — Telephone Encounter (Signed)
Rx(s) sent to pharmacy electronically.  

## 2014-10-23 ENCOUNTER — Other Ambulatory Visit: Payer: Self-pay | Admitting: Physician Assistant

## 2014-11-05 ENCOUNTER — Ambulatory Visit: Payer: Self-pay | Admitting: Cardiovascular Disease

## 2014-11-08 ENCOUNTER — Other Ambulatory Visit: Payer: Self-pay | Admitting: Cardiovascular Disease

## 2014-11-09 NOTE — Telephone Encounter (Signed)
Rx refill sent to patient pharmacy   

## 2014-12-02 ENCOUNTER — Other Ambulatory Visit: Payer: Self-pay | Admitting: *Deleted

## 2014-12-02 MED ORDER — METOPROLOL SUCCINATE ER 25 MG PO TB24: 25.0000 mg | ORAL_TABLET | Freq: Every day | ORAL | Status: DC

## 2014-12-02 NOTE — Telephone Encounter (Signed)
Rx(s) sent to pharmacy electronically. OV 01/11/2015 

## 2014-12-22 ENCOUNTER — Ambulatory Visit (INDEPENDENT_AMBULATORY_CARE_PROVIDER_SITE_OTHER): Payer: BLUE CROSS/BLUE SHIELD | Admitting: Physician Assistant

## 2014-12-22 VITALS — BP 126/82 | HR 85 | Temp 98.1°F | Resp 18 | Ht 59.0 in | Wt 150.0 lb

## 2014-12-22 DIAGNOSIS — J449 Chronic obstructive pulmonary disease, unspecified: Secondary | ICD-10-CM | POA: Diagnosis not present

## 2014-12-22 DIAGNOSIS — R05 Cough: Secondary | ICD-10-CM | POA: Diagnosis not present

## 2014-12-22 DIAGNOSIS — Z720 Tobacco use: Secondary | ICD-10-CM

## 2014-12-22 DIAGNOSIS — J209 Acute bronchitis, unspecified: Secondary | ICD-10-CM | POA: Diagnosis not present

## 2014-12-22 DIAGNOSIS — R059 Cough, unspecified: Secondary | ICD-10-CM

## 2014-12-22 LAB — POCT CBC
GRANULOCYTE PERCENT: 68 % (ref 37–80)
HCT, POC: 44.9 % (ref 37.7–47.9)
HEMOGLOBIN: 15.3 g/dL (ref 12.2–16.2)
Lymph, poc: 2.5 (ref 0.6–3.4)
MCH: 32.2 pg — AB (ref 27–31.2)
MCHC: 33.9 g/dL (ref 31.8–35.4)
MCV: 95 fL (ref 80–97)
MID (CBC): 0.4 (ref 0–0.9)
MPV: 6.6 fL (ref 0–99.8)
POC GRANULOCYTE: 6.3 (ref 2–6.9)
POC LYMPH PERCENT: 27.3 %L (ref 10–50)
POC MID %: 4.7 %M (ref 0–12)
Platelet Count, POC: 238 10*3/uL (ref 142–424)
RBC: 4.73 M/uL (ref 4.04–5.48)
RDW, POC: 13 %
WBC: 9.2 10*3/uL (ref 4.6–10.2)

## 2014-12-22 MED ORDER — AZITHROMYCIN 250 MG PO TABS: ORAL_TABLET | ORAL | Status: DC

## 2014-12-22 MED ORDER — IPRATROPIUM-ALBUTEROL 20-100 MCG/ACT IN AERS: INHALATION_SPRAY | RESPIRATORY_TRACT | Status: DC

## 2014-12-22 MED ORDER — HYDROCOD POLST-CPM POLST ER 10-8 MG/5ML PO SUER: 5.0000 mL | Freq: Two times a day (BID) | ORAL | Status: DC | PRN

## 2014-12-22 MED ORDER — PREDNISONE 20 MG PO TABS: ORAL_TABLET | ORAL | Status: DC

## 2014-12-22 MED ORDER — GUAIFENESIN ER 1200 MG PO TB12: 1.0000 | ORAL_TABLET | Freq: Two times a day (BID) | ORAL | Status: DC | PRN

## 2014-12-22 NOTE — Progress Notes (Signed)
[facility] [address] [place] [address] [phone]  Date:  12/22/2014   Name:  RAFAEL QUESADA   DOB:  18-Jun-1961   MRN:  [id number]  PCP:  No PCP Per Patient    Chief Complaint: Cough; Headache; Sore Throat; and Fatigue   History of Present Illness:  This is a 54 y.o. female who is presenting with cough, headache, sore throat and fatigue x 2 days. Cough is productive of yellow mucous. Feel off and on hot and chilled. Has some SOB at baseline - no worsening of symptoms. Uses combivent inhaler "only once in a while". No current wheezing. States she does not have a cough at baseline. When asked if she has COPD or asthma she states "neither". Having some mild sore throat. Denies fever, nasal congestion. Smokes 10 cigarettes a day but none in the past 2 days. Been taking OTC cold medicine and robitussin over the past 2 days and not helping much. States her symptoms feel similar to 1 year ago when she had bronchitis. At that time she was prescribed azithromycin and prednisone and symptoms improved.  Review of Systems:  Review of Systems See HPI  There are no active problems to display for this patient.   Prior to Admission medications   Medication Sig Start Date End Date Taking? Authorizing Provider  aspirin EC 81 MG tablet Take 81 mg by mouth daily.   Yes Historical Provider, MD  atorvastatin (LIPITOR) 20 MG tablet take 1 tablet by mouth once daily 08/27/14  Yes Runell Gess, MD  fenofibrate (TRICOR) 48 MG tablet Take 1 tablet (48 mg total) by mouth daily. 11/09/14  Yes Runell Gess, MD  fish oil-omega-3 fatty acids 1000 MG capsule Take 2 g by mouth daily.   Yes Historical Provider, MD  Ipratropium-Albuterol (COMBIVENT RESPIMAT) 20-100 MCG/ACT AERS respimat INHALE 1 PUFF BY MOUTH EVERY 6 HOURS.  "OV NEEDED FOR ADDITIONAL REFILLS" 10/24/14  Yes Chelle Jeffery, PA-C  metoprolol succinate (TOPROL-XL) 25 MG 24 hr tablet Take 1 tablet (25 mg total) by mouth  daily. MUST KEEP APPOINTMENT 01/11/2015 WITH DR Allyson Sabal FOR FUTURE REFILLS 12/02/14  Yes Runell Gess, MD    No Known Allergies  Past Surgical History  Procedure Laterality Date  . Cardiovascular stress test  04/07/2010    EKG negative for ischemia. No significant ischemia demonstrated.  . Transthoracic echocardiogram  11/06/2011    EF >55%, normal  . Colonoscopy      History  Substance Use Topics  . Smoking status: Current Every Day Smoker -- 0.50 packs/day    Types: Cigarettes  . Smokeless tobacco: Not on file  . Alcohol Use: No    Family History  Problem Relation Age of Onset  . Heart disease Father   . Cancer Mother     Medication list has been reviewed and updated.  Physical Examination:  Physical Exam  Constitutional: She is oriented to person, place, and time. She appears well-developed and well-nourished. No distress.  HENT:  Head: Normocephalic and atraumatic.  Right Ear: Hearing, tympanic membrane and ear canal normal.  Left Ear: Hearing, tympanic membrane and ear canal normal.  Nose: Nose normal.  Mouth/Throat: Uvula is midline and mucous membranes are normal. Posterior oropharyngeal erythema present. No oropharyngeal exudate or posterior oropharyngeal edema.  Bilateral external canals with flaking skin  Eyes: Conjunctivae and lids are normal. Right eye exhibits no discharge. Left eye exhibits no discharge. No scleral icterus.  Cardiovascular: Normal rate,  regular rhythm, normal heart sounds and normal pulses.   No murmur heard. Pulmonary/Chest: Effort normal and breath sounds normal. No respiratory distress. She has no wheezes. She has no rhonchi. She has no rales.  Short expiratory phase  Musculoskeletal: Normal range of motion.  Lymphadenopathy:       Head (right side): No submental, no submandibular and no tonsillar adenopathy present.       Head (left side): No submental, no submandibular and no tonsillar adenopathy present.    She has no cervical  adenopathy.  Neurological: She is alert and oriented to person, place, and time.  Skin: Skin is warm, dry and intact. No lesion and no rash noted.  Psychiatric: She has a normal mood and affect. Her speech is normal and behavior is normal. Thought content normal.   BP 126/82 mmHg  Pulse 85  Temp(Src) 98.1 F (36.7 C) (Oral)  Resp 18  Ht 4\' 11"  (1.499 m)  Wt 150 lb (68.04 kg)  BMI 30.28 kg/m2  SpO2 96%  Results for orders placed or performed in visit on 12/22/14  POCT CBC  Result Value Ref Range   WBC 9.2 4.6 - 10.2 K/uL   Lymph, poc 2.5 0.6 - 3.4   POC LYMPH PERCENT 27.3 10 - 50 %L   MID (cbc) 0.4 0 - 0.9   POC MID % 4.7 0 - 12 %M   POC Granulocyte 6.3 2 - 6.9   Granulocyte percent 68.0 37 - 80 %G   RBC 4.73 4.04 - 5.48 M/uL   Hemoglobin 15.3 12.2 - 16.2 g/dL   HCT, POC 14.744.9 82.937.7 - 47.9 %   MCV 95.0 80 - 97 fL   MCH, POC 32.2 (A) 27 - 31.2 pg   MCHC 33.9 31.8 - 35.4 g/dL   RDW, POC 56.213.0 %   Platelet Count, POC 238 142 - 424 K/uL   MPV 6.6 0 - 99.8 fL    Assessment and Plan:  1. Cough 2. Acute bronchitis 3. COPD, unspecified 4. Tobacco abuse CBC wnl. Vitals normal with o2 sat 96%. Worried that patient will progress to COPD exacerbation as she had an exacerbation requiring antibiotics and prednisone 1 year ago. Will treat current symptoms with prednisone taper, tussionex, mucinex and prn combivent. If symptoms are not improving in 4 days, she will fill prescription for zpak. Counseled on tobacco abuse. Return in 7-10 days if symptoms not improved or at any time if symptoms worsen. - POCT CBC - chlorpheniramine-HYDROcodone (TUSSIONEX PENNKINETIC ER) 10-8 MG/5ML SUER; Take 5 mLs by mouth every 12 (twelve) hours as needed for cough.  Dispense: 100 mL; Refill: 0 - predniSONE (DELTASONE) 20 MG tablet; Take 3 PO QAM x3days, 2 PO QAM x3days, 1 PO QAM x3days  Dispense: 18 tablet; Refill: 0 - azithromycin (ZITHROMAX) 250 MG tablet; Take 2 tabs PO x 1 dose, then 1 tab PO QD x 4 days   Dispense: 6 tablet; Refill: 0 - Guaifenesin (MUCINEX MAXIMUM STRENGTH) 1200 MG TB12; Take 1 tablet (1,200 mg total) by mouth every 12 (twelve) hours as needed.  Dispense: 14 tablet; Refill: 1 - Ipratropium-Albuterol (COMBIVENT RESPIMAT) 20-100 MCG/ACT AERS respimat; INHALE 1 PUFF BY MOUTH EVERY 6 HOURS.  "OV NEEDED FOR ADDITIONAL REFILLS"  Dispense: 4 g; Refill: 0   Nicole V. Dyke BrackettBush, PA-C, MHS [facility]  12/22/2014

## 2014-12-22 NOTE — Patient Instructions (Addendum)
Start prednisone taper in the morning. Take mucinex twice a day and drink with plenty of water. Use cough syrup for sleep. Do not drive or operate heavy machinery while on medication. Use combivent as needed for shortness of breath and wheezing. If symptoms not improving by Sunday, you may fill antibiotics.  Return if symptoms not improving in 7-10 days.

## 2015-01-11 ENCOUNTER — Encounter: Payer: Self-pay | Admitting: Cardiovascular Disease

## 2015-01-11 ENCOUNTER — Ambulatory Visit (INDEPENDENT_AMBULATORY_CARE_PROVIDER_SITE_OTHER): Payer: BLUE CROSS/BLUE SHIELD | Admitting: Cardiovascular Disease

## 2015-01-11 ENCOUNTER — Ambulatory Visit: Payer: Self-pay | Admitting: Cardiovascular Disease

## 2015-01-11 VITALS — BP 120/70 | HR 75 | Ht 59.0 in | Wt 152.7 lb

## 2015-01-11 DIAGNOSIS — Z8249 Family history of ischemic heart disease and other diseases of the circulatory system: Secondary | ICD-10-CM | POA: Insufficient documentation

## 2015-01-11 DIAGNOSIS — Z720 Tobacco use: Secondary | ICD-10-CM | POA: Diagnosis not present

## 2015-01-11 DIAGNOSIS — Z79899 Other long term (current) drug therapy: Secondary | ICD-10-CM | POA: Diagnosis not present

## 2015-01-11 DIAGNOSIS — E785 Hyperlipidemia, unspecified: Secondary | ICD-10-CM | POA: Insufficient documentation

## 2015-01-11 MED ORDER — ATORVASTATIN CALCIUM 20 MG PO TABS: 20.0000 mg | ORAL_TABLET | Freq: Every day | ORAL | Status: DC

## 2015-01-11 MED ORDER — FENOFIBRATE 48 MG PO TABS: 48.0000 mg | ORAL_TABLET | Freq: Every day | ORAL | Status: DC

## 2015-01-11 MED ORDER — METOPROLOL SUCCINATE ER 25 MG PO TB24: 25.0000 mg | ORAL_TABLET | Freq: Every day | ORAL | Status: DC

## 2015-01-11 NOTE — Assessment & Plan Note (Signed)
History of hyperlipidemia on atorvastatin 20 mg a day. We will recheck a lipid and liver profile 

## 2015-01-11 NOTE — Patient Instructions (Signed)
Your physician recommends that you return for lab work at your earliest convenience - FASTING.  Dr Berry recommends that you schedule a follow-up appointment in 1 year. You will receive a reminder letter in the mail two months in advance. If you don't receive a letter, please call our office to schedule the follow-up appointment. 

## 2015-01-11 NOTE — Assessment & Plan Note (Signed)
Continues to smoke one half pack per day. Recalcitrant risk factor modification

## 2015-01-11 NOTE — Progress Notes (Signed)
01/11/2015 Angelica [name]   11-18-1960  [id number]  Primary Physician Martha Clan, MD Primary Cardiologist: Runell Gess MD [place], [place]   HPI:  Angelica [name] is a 54y.o. female who presents for routine follow up of HTN, hyperlipidemia, obesity, strong family history of coronary disease (father dying after CABG in his 74's). She used to be followed by Dr. Alanda Amass.  She reports feeling well. She denies any shortness of breath, chest pain, palpitations or lower extremity swelling. No dizziness or fatigue. She continues to work at [employer] where she is on her feet all day. She works 3rd shift and states that she has not had time to exercise. She continues to smoke 1/2 pack per day. She has smoked since the age of 1-16yo. She is compliant with her medications.   She had a myoview stress test in September 2011 which was low risk. She has not required catheterization. Since I saw her 18 months ago she's remained currently stable.    Current Outpatient Prescriptions  Medication Sig Dispense Refill  . aspirin EC 81 MG tablet Take 81 mg by mouth daily.    Marland Kitchen atorvastatin (LIPITOR) 20 MG tablet take 1 tablet by mouth once daily 30 tablet 2  . azithromycin (ZITHROMAX) 250 MG tablet Take 2 tabs PO x 1 dose, then 1 tab PO QD x 4 days 6 tablet 0  . chlorpheniramine-HYDROcodone (TUSSIONEX PENNKINETIC ER) 10-8 MG/5ML SUER Take 5 mLs by mouth every 12 (twelve) hours as needed for cough. 100 mL 0  . fenofibrate (TRICOR) 48 MG tablet Take 1 tablet (48 mg total) by mouth daily. 30 tablet 1  . fish oil-omega-3 fatty acids 1000 MG capsule Take 2 g by mouth daily.    . Guaifenesin (MUCINEX MAXIMUM STRENGTH) 1200 MG TB12 Take 1 tablet (1,200 mg total) by mouth every 12 (twelve) hours as needed. 14 tablet 1  . Ipratropium-Albuterol (COMBIVENT RESPIMAT) 20-100 MCG/ACT AERS respimat INHALE 1 PUFF BY MOUTH EVERY 6 HOURS.  "OV NEEDED FOR ADDITIONAL REFILLS" 4 g 0  . metoprolol succinate  (TOPROL-XL) 25 MG 24 hr tablet Take 1 tablet (25 mg total) by mouth daily. MUST KEEP APPOINTMENT 01/11/2015 WITH DR BERRY FOR FUTURE REFILLS 30 tablet 1  . predniSONE (DELTASONE) 20 MG tablet Take 3 PO QAM x3days, 2 PO QAM x3days, 1 PO QAM x3days 18 tablet 0   No current facility-administered medications for this visit.    No Known Allergies  History   Social History  . Marital Status: Married    Spouse Name: N/A  . Number of Children: N/A  . Years of Education: N/A   Occupational History  . Not on file.   Social History Main Topics  . Smoking status: Current Every Day Smoker -- 0.50 packs/day    Types: Cigarettes  . Smokeless tobacco: Not on file  . Alcohol Use: No  . Drug Use: No  . Sexual Activity: No   Other Topics Concern  . Not on file   Social History Narrative     Review of Systems: General: negative for chills, fever, night sweats or weight changes.  Cardiovascular: negative for chest pain, dyspnea on exertion, edema, orthopnea, palpitations, paroxysmal nocturnal dyspnea or shortness of breath Dermatological: negative for rash Respiratory: negative for cough or wheezing Urologic: negative for hematuria Abdominal: negative for nausea, vomiting, diarrhea, bright red blood per rectum, melena, or hematemesis Neurologic: negative for visual changes, syncope, or dizziness All other systems reviewed and are otherwise negative  except as noted above.    Blood pressure 120/70, pulse 75, height 4\' 11"  (1.499 m), weight 152 lb 11.2 oz (69.264 kg).  General appearance: alert and no distress Neck: no adenopathy, no carotid bruit, no JVD, supple, symmetrical, trachea midline and thyroid not enlarged, symmetric, no tenderness/mass/nodules Lungs: clear to auscultation bilaterally Heart: regular rate and rhythm, S1, S2 normal, no murmur, click, rub or gallop Extremities: extremities normal, atraumatic, no cyanosis or edema  EKG normal sinus rhythm at 75 ST or T-wave changes.  I personally reviewed this EKG  ASSESSMENT AND PLAN:   Tobacco abuse Continues to smoke one half pack per day. Recalcitrant risk factor modification  Hyperlipidemia History of hyperlipidemia on atorvastatin 20 mg a day. We will recheck a lipid and liver profile      Runell GessJonathan J. Berry MD [facility] Surgery CenterFACP,FACC,FAHA, [facility] Hospital[facility]SCAI 01/11/2015 7:53 AM

## 2015-01-12 ENCOUNTER — Ambulatory Visit: Payer: Self-pay | Admitting: Cardiovascular Disease

## 2015-02-02 LAB — LDL CHOLESTEROL, DIRECT: Direct LDL: 130 mg/dL — ABNORMAL HIGH

## 2015-02-02 LAB — LIPID PANEL
CHOL/HDL RATIO: 4.8 ratio
CHOLESTEROL: 238 mg/dL — AB (ref 0–200)
HDL: 50 mg/dL (ref >=46)
Triglycerides: 472 mg/dL — ABNORMAL HIGH (ref <150)

## 2015-02-02 LAB — HEPATIC FUNCTION PANEL
ALBUMIN: 4.4 g/dL (ref 3.5–5.2)
ALK PHOS: 78 U/L (ref 39–117)
ALT: 23 U/L (ref 0–35)
AST: 25 U/L (ref 0–37)
BILIRUBIN TOTAL: 0.7 mg/dL (ref 0.2–1.2)
Bilirubin, Direct: 0.1 mg/dL (ref 0.0–0.3)
Indirect Bilirubin: 0.6 mg/dL (ref 0.2–1.2)
Total Protein: 7.7 g/dL (ref 6.0–8.3)

## 2015-02-10 ENCOUNTER — Encounter: Payer: Self-pay | Admitting: *Deleted

## 2015-02-21 ENCOUNTER — Telehealth: Payer: Self-pay | Admitting: Cardiovascular Disease

## 2015-02-21 DIAGNOSIS — Z79899 Other long term (current) drug therapy: Secondary | ICD-10-CM

## 2015-02-21 DIAGNOSIS — E785 Hyperlipidemia, unspecified: Secondary | ICD-10-CM

## 2015-02-21 MED ORDER — FENOFIBRATE 48 MG PO TABS: 48.0000 mg | ORAL_TABLET | Freq: Every day | ORAL | Status: DC

## 2015-02-21 MED ORDER — ATORVASTATIN CALCIUM 80 MG PO TABS: 80.0000 mg | ORAL_TABLET | Freq: Every day | ORAL | Status: DC

## 2015-02-21 MED ORDER — COENZYME Q10 200 MG PO CAPS: 200.0000 mg | ORAL_CAPSULE | Freq: Every day | ORAL | Status: DC

## 2015-02-21 NOTE — Telephone Encounter (Signed)
Left msg for patient to call. 

## 2015-02-21 NOTE — Telephone Encounter (Signed)
Pt is returning Nathans call  ° °Thanks  °

## 2015-02-21 NOTE — Telephone Encounter (Signed)
Gave instructions to resume fenofibrate, increase atorvastatin dose to  daily, add Co Q 10  daily per last result note instruction.  Discussed med changes, advised on potential SE's. Instructed to call for any concerns.  Gave instruction for repeat lipids in 2 months post-med changes. Mailed order to patient.  Pt voiced understanding, all questions answered to her satisfaction.

## 2015-02-21 NOTE — Telephone Encounter (Signed)
Ms. Angelica [name] is calling about reults , she received a letter and it said for her to call, Please call .Marland Kitchen FYI: she works the night shift and said she is usually up until about 11am . Call on this number .Marland [phone]   Thanks

## 2015-03-01 ENCOUNTER — Other Ambulatory Visit: Payer: Self-pay | Admitting: Physician Assistant

## 2015-04-25 ENCOUNTER — Telehealth: Payer: Self-pay | Admitting: Cardiovascular Disease

## 2015-04-25 NOTE — Telephone Encounter (Signed)
Called, goes straight to voice mail. Left message that lab slips are at front desk.

## 2015-04-25 NOTE — Telephone Encounter (Signed)
Patient was told to have repeat lab work done this week for her cholesterol and liver functions.  She would like to pick up the orders tomorrow so she can have this done on Wednesday of this week .

## 2015-05-06 ENCOUNTER — Other Ambulatory Visit: Payer: Self-pay | Admitting: Physician Assistant

## 2015-05-12 LAB — HEPATIC FUNCTION PANEL
ALT: 25 U/L (ref 6–29)
AST: 29 U/L (ref 10–35)
Albumin: 4.4 g/dL (ref 3.6–5.1)
Alkaline Phosphatase: 78 U/L (ref 33–130)
BILIRUBIN INDIRECT: 0.4 mg/dL (ref 0.2–1.2)
Bilirubin, Direct: 0.1 mg/dL (ref <=0.2)
TOTAL PROTEIN: 6.9 g/dL (ref 6.1–8.1)
Total Bilirubin: 0.5 mg/dL (ref 0.2–1.2)

## 2015-05-12 LAB — LIPID PANEL
CHOLESTEROL: 224 mg/dL — AB (ref 125–200)
HDL: 51 mg/dL (ref >=46)
TRIGLYCERIDES: 427 mg/dL — AB (ref <150)
Total CHOL/HDL Ratio: 4.4 Ratio (ref <=5.0)

## 2015-05-12 LAB — LDL CHOLESTEROL, DIRECT: Direct LDL: 119 mg/dL (ref <130)

## 2015-05-17 ENCOUNTER — Telehealth: Payer: Self-pay

## 2015-05-17 NOTE — Telephone Encounter (Signed)
Patient of Lanier Clamicole Bush, [place]. She is out of her inhaler and requests a refill. Preferred [facility] [address]. Please call when done at [phone]. She is totally out of this medication.

## 2015-05-19 ENCOUNTER — Telehealth: Payer: Self-pay | Admitting: Cardiovascular Disease

## 2015-05-19 MED ORDER — IPRATROPIUM-ALBUTEROL 20-100 MCG/ACT IN AERS: INHALATION_SPRAY | RESPIRATORY_TRACT | Status: DC

## 2015-05-19 NOTE — Telephone Encounter (Signed)
Rx sent.Called numbers on file, they are wrong.

## 2015-05-19 NOTE — Telephone Encounter (Signed)
Patient is returning the call regarding her lab results.

## 2015-05-19 NOTE — Telephone Encounter (Signed)
Left message for patient to call back  

## 2015-06-08 ENCOUNTER — Telehealth: Payer: Self-pay | Admitting: Cardiovascular Disease

## 2015-06-08 DIAGNOSIS — E785 Hyperlipidemia, unspecified: Secondary | ICD-10-CM

## 2015-06-08 MED ORDER — FENOFIBRATE 160 MG PO TABS: 160.0000 mg | ORAL_TABLET | Freq: Every day | ORAL | Status: DC

## 2015-06-08 MED ORDER — EZETIMIBE 10 MG PO TABS: 10.0000 mg | ORAL_TABLET | Freq: Every day | ORAL | Status: DC

## 2015-06-08 NOTE — Telephone Encounter (Signed)
Angelica [name] is calling about her lab results . Please call    Thanks

## 2015-06-08 NOTE — Telephone Encounter (Signed)
Spoke with pt, aware of lab results. Patient voiced understanding of medication change. New script sent to the pharmacy. Lab orders mailed to the pt.

## 2015-06-22 ENCOUNTER — Other Ambulatory Visit: Payer: Self-pay | Admitting: Physician Assistant

## 2015-06-25 ENCOUNTER — Ambulatory Visit (INDEPENDENT_AMBULATORY_CARE_PROVIDER_SITE_OTHER): Payer: BLUE CROSS/BLUE SHIELD | Admitting: Emergency Medicine

## 2015-06-25 ENCOUNTER — Ambulatory Visit (INDEPENDENT_AMBULATORY_CARE_PROVIDER_SITE_OTHER): Payer: BLUE CROSS/BLUE SHIELD

## 2015-06-25 VITALS — BP 126/84 | HR 94 | Temp 97.9°F | Resp 18 | Ht 59.0 in | Wt 147.4 lb

## 2015-06-25 DIAGNOSIS — R059 Cough, unspecified: Secondary | ICD-10-CM

## 2015-06-25 DIAGNOSIS — R6883 Chills (without fever): Secondary | ICD-10-CM | POA: Diagnosis not present

## 2015-06-25 DIAGNOSIS — R05 Cough: Secondary | ICD-10-CM

## 2015-06-25 DIAGNOSIS — R0602 Shortness of breath: Secondary | ICD-10-CM

## 2015-06-25 DIAGNOSIS — F17200 Nicotine dependence, unspecified, uncomplicated: Secondary | ICD-10-CM | POA: Diagnosis not present

## 2015-06-25 DIAGNOSIS — R51 Headache: Secondary | ICD-10-CM

## 2015-06-25 LAB — CBC WITH DIFFERENTIAL/PLATELET
Basophils Absolute: 0.1 10*3/uL (ref 0.0–0.1)
Basophils Relative: 1 % (ref 0–1)
EOS PCT: 4 % (ref 0–5)
Eosinophils Absolute: 0.3 10*3/uL (ref 0.0–0.7)
HEMATOCRIT: 45.4 % (ref 36.0–46.0)
HEMOGLOBIN: 15.6 g/dL — AB (ref 12.0–15.0)
LYMPHS ABS: 2.4 10*3/uL (ref 0.7–4.0)
LYMPHS PCT: 35 % (ref 12–46)
MCH: 33.2 pg (ref 26.0–34.0)
MCHC: 34.4 g/dL (ref 30.0–36.0)
MCV: 96.6 fL (ref 78.0–100.0)
MONO ABS: 0.9 10*3/uL (ref 0.1–1.0)
MONOS PCT: 13 % — AB (ref 3–12)
MPV: 8.9 fL (ref 8.6–12.4)
NEUTROS ABS: 3.2 10*3/uL (ref 1.7–7.7)
Neutrophils Relative %: 47 % (ref 43–77)
Platelets: 259 10*3/uL (ref 150–400)
RBC: 4.7 MIL/uL (ref 3.87–5.11)
RDW: 12.8 % (ref 11.5–15.5)
WBC: 6.9 10*3/uL (ref 4.0–10.5)

## 2015-06-25 LAB — POCT INFLUENZA A/B
INFLUENZA A, POC: NEGATIVE
Influenza B, POC: NEGATIVE

## 2015-06-25 LAB — POCT CBC
GRANULOCYTE PERCENT: 59 % (ref 37–80)
HEMATOCRIT: 42.5 % (ref 37.7–47.9)
Hemoglobin: 15.6 g/dL (ref 12.2–16.2)
LYMPH, POC: 2.6 (ref 0.6–3.4)
MCH, POC: 34.3 pg — AB (ref 27–31.2)
MCHC: 36.7 g/dL — AB (ref 31.8–35.4)
MCV: 93.4 fL (ref 80–97)
MID (cbc): 0.3 (ref 0–0.9)
MPV: 7.1 fL (ref 0–99.8)
POC GRANULOCYTE: 4.2 (ref 2–6.9)
POC LYMPH %: 36.9 % (ref 10–50)
POC MID %: 4.1 % (ref 0–12)
Platelet Count, POC: 236 10*3/uL (ref 142–424)
RBC: 4.56 M/uL (ref 4.04–5.48)
RDW, POC: 12.5 %
WBC: 7.1 10*3/uL (ref 4.6–10.2)

## 2015-06-25 MED ORDER — DOXYCYCLINE HYCLATE 100 MG PO CAPS: 100.0000 mg | ORAL_CAPSULE | Freq: Two times a day (BID) | ORAL | Status: DC

## 2015-06-25 MED ORDER — FLUTICASONE PROPIONATE 50 MCG/ACT NA SUSP: 2.0000 | Freq: Every day | NASAL | Status: DC

## 2015-06-25 NOTE — Patient Instructions (Signed)
Please usually her inhaler 3 times a day  Acute Bronchitis Bronchitis is inflammation of the airways that extend from the windpipe into the lungs (bronchi). The inflammation often causes mucus to develop. This leads to a cough, which is the most common symptom of bronchitis.  In acute bronchitis, the condition usually develops suddenly and goes away over time, usually in a couple weeks. Smoking, allergies, and asthma can make bronchitis worse. Repeated episodes of bronchitis may cause further lung problems.  CAUSES Acute bronchitis is most often caused by the same virus that causes a cold. The virus can spread from person to person (contagious) through coughing, sneezing, and touching contaminated objects. SIGNS AND SYMPTOMS   Cough.   Fever.   Coughing up mucus.   Body aches.   Chest congestion.   Chills.   Shortness of breath.   Sore throat.  DIAGNOSIS  Acute bronchitis is usually diagnosed through a physical exam. Your health care provider will also ask you questions about your medical history. Tests, such as chest X-rays, are sometimes done to rule out other conditions.  TREATMENT  Acute bronchitis usually goes away in a couple weeks. Oftentimes, no medical treatment is necessary. Medicines are sometimes given for relief of fever or cough. Antibiotic medicines are usually not needed but may be prescribed in certain situations. In some cases, an inhaler may be recommended to help reduce shortness of breath and control the cough. A cool mist vaporizer may also be used to help thin bronchial secretions and make it easier to clear the chest.  HOME CARE INSTRUCTIONS  Get plenty of rest.   Drink enough fluids to keep your urine clear or pale yellow (unless you have a medical condition that requires fluid restriction). Increasing fluids may help thin your respiratory secretions (sputum) and reduce chest congestion, and it will prevent dehydration.   Take medicines only as  directed by your health care provider.  If you were prescribed an antibiotic medicine, finish it all even if you start to feel better.  Avoid smoking and secondhand smoke. Exposure to cigarette smoke or irritating chemicals will make bronchitis worse. If you are a smoker, consider using nicotine gum or skin patches to help control withdrawal symptoms. Quitting smoking will help your lungs heal faster.   Reduce the chances of another bout of acute bronchitis by washing your hands frequently, avoiding people with cold symptoms, and trying not to touch your hands to your mouth, nose, or eyes.   Keep all follow-up visits as directed by your health care provider.  SEEK MEDICAL CARE IF: Your symptoms do not improve after 1 week of treatment.  SEEK IMMEDIATE MEDICAL CARE IF:  You develop an increased fever or chills.   You have chest pain.   You have severe shortness of breath.  You have bloody sputum.   You develop dehydration.  You faint or repeatedly feel like you are going to pass out.  You develop repeated vomiting.  You develop a severe headache. MAKE SURE YOU:   Understand these instructions.  Will watch your condition.  Will get help right away if you are not doing well or get worse.   This information is not intended to replace advice given to you by your health care provider. Make sure you discuss any questions you have with your health care provider.   Document Released: 08/09/2004 Document Revised: 07/23/2014 Document Reviewed: 12/23/2012 Elsevier Interactive Patient Education [employer]2016 [employer]

## 2015-06-25 NOTE — Progress Notes (Addendum)
Patient ID: Angelica [name], female   DOB: [birth year], 54 y.o.   MRN: [id number]     By signing my name below, I, Angelica [name], attest that this documentation has been prepared under the direction and in the presence of Angelica Chris, MD.  Electronically Signed: Littie [name], Medical Scribe. 06/25/2015. 8:33 AM.   Chief Complaint:  Chief Complaint  Patient presents with  . Flu Symptoms    since Wednesday, chills, no appetite. OTC meds are not helping. Fatigue.     HPI: Angelica [name] is a 54 y.o. female with a history of tobacco use who reports to [facility] today complaining of gradual onset, dry cough that started 4 days ago. Patient reports having associated chills, headache, loss of appetite, congestion, mild sore throat, fatigue, mild generalized myalgias, and mild SOB. She notes that her son was ill with flu-like symptoms 2 weeks ago. Patient denies fever. She admits to smoking about 0.5 ppd. She does have Combivent Respimat for SOB to use as needed, which is not every day.   Past Medical History  Diagnosis Date  . Murmur   . Hyperlipidemia   . Tobacco use    Past Surgical History  Procedure Laterality Date  . Cardiovascular stress test  04/07/2010    EKG negative for ischemia. No significant ischemia demonstrated.  . Transthoracic echocardiogram  11/06/2011    EF >55%, normal  . Colonoscopy     Social History   Social History  . Marital Status: Married    Spouse Name: N/A  . Number of Children: N/A  . Years of Education: N/A   Social History Main Topics  . Smoking status: Current Every Day Smoker -- 0.50 packs/day    Types: Cigarettes  . Smokeless tobacco: None  . Alcohol Use: No  . Drug Use: No  . Sexual Activity: No   Other Topics Concern  . None   Social History Narrative   Family History  Problem Relation Age of Onset  . Heart disease Father   . Cancer Mother    No Known Allergies Prior to Admission medications   Medication Sig Start Date End Date Taking?  Authorizing Provider  aspirin EC 81 MG tablet Take 81 mg by mouth daily.   Yes Historical Provider, MD  atorvastatin (LIPITOR) 80 MG tablet Take 1 tablet (80 mg total) by mouth daily. 02/21/15  Yes Runell Gess, MD  Coenzyme Q10 200 MG capsule Take 1 capsule (200 mg total) by mouth daily. 02/21/15  Yes Runell Gess, MD  ezetimibe (ZETIA) 10 MG tablet Take 1 tablet (10 mg total) by mouth daily. 06/08/15  Yes Runell Gess, MD  fenofibrate 160 MG tablet Take 1 tablet (160 mg total) by mouth daily. 06/08/15  Yes Runell Gess, MD  fish oil-omega-3 fatty acids 1000 MG capsule Take 2 g by mouth daily.   Yes Historical Provider, MD  Ipratropium-Albuterol (COMBIVENT RESPIMAT) 20-100 MCG/ACT AERS respimat Inhale 1 puff into the lungs every 6 (six) hours as needed. PATIENT NEEDS OFFICE VISIT FOR ADDITIONAL REFILLS 06/24/15  Yes Wallis Bamberg, PA-C  metoprolol succinate (TOPROL-XL) 25 MG 24 hr tablet Take 1 tablet (25 mg total) by mouth daily. 01/11/15  Yes Runell Gess, MD  azithromycin (ZITHROMAX) 250 MG tablet Take 2 tabs PO x 1 dose, then 1 tab PO QD x 4 days Patient not taking: Reported on 06/25/2015 12/22/14   Dorna Leitz, PA-C  chlorpheniramine-HYDROcodone (TUSSIONEX PENNKINETIC ER) 10-8 MG/5ML SUER Take 5 mLs  by mouth every 12 (twelve) hours as needed for cough. Patient not taking: Reported on 06/25/2015 12/22/14   Dorna Leitz, PA-C  Guaifenesin [facility] MAXIMUM STRENGTH) 1200 MG TB12 Take 1 tablet (1,200 mg total) by mouth every 12 (twelve) hours as needed. Patient not taking: Reported on 06/25/2015 12/22/14   Dorna Leitz, PA-C  predniSONE (DELTASONE) 20 MG tablet Take 3 PO QAM x3days, 2 PO QAM x3days, 1 PO QAM x3days Patient not taking: Reported on 06/25/2015 12/22/14   Dorna Leitz, PA-C     ROS: The patient denies fevers, chills, night sweats, unintentional weight loss, chest pain, palpitations, wheezing, nausea, vomiting, abdominal pain, dysuria, hematuria, melena, numbness, weakness,  or tingling.   All other systems have been reviewed and were otherwise negative with the exception of those mentioned in the HPI and as above.    PHYSICAL EXAM: Filed Vitals:   06/25/15 0829  BP: 126/84  Pulse: 94  Temp: 97.9 F (36.6 C)  Resp: 18   Body mass index is 29.76 kg/(m^2).   General: Alert, no acute distress HEENT:  Normocephalic, atraumatic, oropharynx patent. Raspy voice. Ears normal. Throat slightly red. Neck: Supple. Eye: Nonie Hoyer [facility] Cardiovascular:  Regular rate and rhythm. Respiratory: Bilateral rhonchi and wheezes. Abdominal: No organomegaly, abdomen is soft and non-tender, positive bowel sounds.  No masses. Musculoskeletal: Gait intact. No edema, tenderness Skin: No rashes. Neurologic: Facial musculature symmetric. Psychiatric: Patient acts appropriately throughout our interaction. Lymphatic: No cervical or submandibular lymphadenopathy      LABS: Results for orders placed or performed in visit on 06/25/15  POCT Influenza A/B  Result Value Ref Range   Influenza A, POC Negative Negative   Influenza B, POC Negative Negative  POCT CBC  Result Value Ref Range   WBC 7.1 4.6 - 10.2 K/uL   Lymph, poc 2.6 0.6 - 3.4   POC LYMPH PERCENT 36.9 10 - 50 %L   MID (cbc) 0.3 0 - 0.9   POC MID % 4.1 0 - 12 %M   POC Granulocyte 4.2 2 - 6.9   Granulocyte percent 59.0 37 - 80 %G   RBC 4.56 4.04 - 5.48 M/uL   Hemoglobin 15.6 12.2 - 16.2 g/dL   HCT, POC 16.1 09.6 - 47.9 %   MCV 93.4 80 - 97 fL   MCH, POC 34.3 (A) 27 - 31.2 pg   MCHC 36.7 (A) 31.8 - 35.4 g/dL   RDW, POC 04.5 %   Platelet Count, POC 236 142 - 424 K/uL   MPV 7.1 0 - 99.8 fL   Meds ordered this encounter  Medications  . doxycycline (VIBRAMYCIN) 100 MG capsule    Sig: Take 1 capsule (100 mg total) by mouth 2 (two) times daily.    Dispense:  20 capsule    Refill:  0  . fluticasone (FLONASE) 50 MCG/ACT nasal spray    Sig: Place 2 sprays into both nostrils daily.    Dispense:  16 g    Refill:   6    EKG/XRAY:   Primary read interpreted by Dr. Cleta Alberts at [facility]. No acute disease   ASSESSMENT/PLAN: Patient presents with bronchitis. She smokes a half pack a day. She will be on doxycycline and Flonase. She has a Combivent inhaler and she will use this regularly. She  typically only uses it once a day. She was given a note to return to work on Monday. She is not ready to stop smoking at the present time.I personally performed the services  described in this documentation, which was scribed in my presence. The recorded information has been reviewed and is accurate.Michaell Cowing.    Gross sideeffects, risk and benefits, and alternatives of medications d/w patient. Patient is aware that all medications have potential sideeffects and we are unable to predict every sideeffect or drug-drug interaction that may occur.  Angelica ChrisSteven Tykwon Fera MD 06/25/2015 8:33 AM

## 2015-09-09 ENCOUNTER — Telehealth: Payer: Self-pay

## 2015-09-09 NOTE — Telephone Encounter (Signed)
Pt is needing a refill on inhaler  Best number [phone]

## 2015-09-10 ENCOUNTER — Other Ambulatory Visit: Payer: Self-pay | Admitting: *Deleted

## 2015-09-10 DIAGNOSIS — R062 Wheezing: Secondary | ICD-10-CM

## 2015-09-10 MED ORDER — IPRATROPIUM-ALBUTEROL 20-100 MCG/ACT IN AERS: 1.0000 | INHALATION_SPRAY | Freq: Four times a day (QID) | RESPIRATORY_TRACT | Status: DC | PRN

## 2015-09-19 LAB — LIPID PANEL
Cholesterol: 176 mg/dL (ref 125–200)
HDL: 55 mg/dL (ref >=46)
LDL Cholesterol: 74 mg/dL (ref <130)
TRIGLYCERIDES: 234 mg/dL — AB (ref <150)
Total CHOL/HDL Ratio: 3.2 Ratio (ref <=5.0)
VLDL: 47 mg/dL — AB (ref <30)

## 2015-09-19 LAB — HEPATIC FUNCTION PANEL
ALBUMIN: 4.4 g/dL (ref 3.6–5.1)
ALK PHOS: 75 U/L (ref 33–130)
ALT: 19 U/L (ref 6–29)
AST: 25 U/L (ref 10–35)
BILIRUBIN DIRECT: 0.1 mg/dL (ref <=0.2)
BILIRUBIN INDIRECT: 0.6 mg/dL (ref 0.2–1.2)
BILIRUBIN TOTAL: 0.7 mg/dL (ref 0.2–1.2)
Total Protein: 7.4 g/dL (ref 6.1–8.1)

## 2015-10-13 ENCOUNTER — Other Ambulatory Visit: Payer: Self-pay | Admitting: Urgent Care

## 2015-10-17 ENCOUNTER — Other Ambulatory Visit: Payer: Self-pay | Admitting: Urgent Care

## 2015-11-10 ENCOUNTER — Other Ambulatory Visit: Payer: Self-pay | Admitting: Physician Assistant

## 2015-12-04 ENCOUNTER — Other Ambulatory Visit: Payer: Self-pay | Admitting: Physician Assistant

## 2015-12-27 ENCOUNTER — Other Ambulatory Visit: Payer: Self-pay | Admitting: Emergency Medicine

## 2016-01-13 ENCOUNTER — Encounter: Payer: Self-pay | Admitting: Cardiovascular Disease

## 2016-01-13 ENCOUNTER — Ambulatory Visit (INDEPENDENT_AMBULATORY_CARE_PROVIDER_SITE_OTHER): Payer: BLUE CROSS/BLUE SHIELD | Admitting: Cardiovascular Disease

## 2016-01-13 VITALS — BP 122/79 | HR 73 | Ht 59.0 in | Wt 149.8 lb

## 2016-01-13 DIAGNOSIS — E785 Hyperlipidemia, unspecified: Secondary | ICD-10-CM

## 2016-01-13 DIAGNOSIS — I10 Essential (primary) hypertension: Secondary | ICD-10-CM | POA: Diagnosis not present

## 2016-01-13 DIAGNOSIS — Z720 Tobacco use: Secondary | ICD-10-CM

## 2016-01-13 DIAGNOSIS — Z0000 Encounter for general adult medical examination without abnormal findings: Secondary | ICD-10-CM | POA: Diagnosis not present

## 2016-01-13 NOTE — Assessment & Plan Note (Signed)
History of hyperlipidemia on statin therapy with recent lipid profile performed 09/19/15 revealing total cholesterol of 176, LDL 74 and HDL of 55.

## 2016-01-13 NOTE — Patient Instructions (Signed)
Medication Instructions:  Your physician recommends that you continue on your current medications as directed. Please refer to the Current Medication list given to you today.   Follow-Up: Your physician wants you to follow-up in: 12 MONTHS WITH DR BERRY. You will receive a reminder letter in the mail two months in advance. If you don't receive a letter, please call our office to schedule the follow-up appointment.   Any Other Special Instructions Will Be Listed Below (If Applicable).     If you need a refill on your cardiac medications before your next appointment, please call your pharmacy.   

## 2016-01-13 NOTE — Progress Notes (Signed)
01/13/2016 Angelica DecCynthia L Polka   [birth year]  [id number]  Primary Physician Martha ClanShaw, William, MD Primary Cardiologist: Runell GessJonathan J Alfonso Shackett MD Nicholes CalamityFACP, FACC, FAHA, [place]  HPI:  Angelica [name] is a [age] female who presents for routine follow up of HTN, hyperlipidemia, obesity, strong family history of coronary disease (father dying after CABG in his 5750's). She used to be followed by Dr. Alanda AmassWeintraub. I last saw her in the office 01/11/15 She reports feeling well. She denies any shortness of breath, chest pain, palpitations or lower extremity swelling. No dizziness or fatigue. She continues to work at [employer] where she is on her feet all day. She works 3rd shift and states that she has not had time to exercise. She continues to smoke 1/2 pack per day. She has smoked since the age of [age]yo. She is compliant with her medications.   She had a myoview stress test in September 2011 which was low risk. She has not required catheterization. Since I saw her 12 months ago she's remained currently stable.    Current Outpatient Prescriptions  Medication Sig Dispense Refill  . aspirin EC 81 MG tablet Take 81 mg by mouth daily.    Marland Kitchen. atorvastatin (LIPITOR) 80 MG tablet Take 1 tablet (80 mg total) by mouth daily. 90 tablet 3  . COMBIVENT RESPIMAT 20-100 MCG/ACT AERS respimat inhale 1 puff by mouth every 6 hours if needed 1 Inhaler 0  . ezetimibe (ZETIA) 10 MG tablet Take 1 tablet (10 mg total) by mouth daily. 30 tablet 12  . fenofibrate 160 MG tablet Take 1 tablet (160 mg total) by mouth daily. 30 tablet 12  . fish oil-omega-3 fatty acids 1000 MG capsule Take 2 g by mouth daily.    . metoprolol succinate (TOPROL-XL) 25 MG 24 hr tablet Take 1 tablet (25 mg total) by mouth daily. 30 tablet 11  . azithromycin (ZITHROMAX) 250 MG tablet Take 2 tabs PO x 1 dose, then 1 tab PO QD x 4 days (Patient not taking: Reported on 06/25/2015) 6 tablet 0  . chlorpheniramine-HYDROcodone (TUSSIONEX PENNKINETIC ER) 10-8 MG/5ML SUER Take 5  mLs by mouth every 12 (twelve) hours as needed for cough. (Patient not taking: Reported on 06/25/2015) 100 mL 0  . Coenzyme Q10 200 MG capsule Take 1 capsule (200 mg total) by mouth daily. (Patient not taking: Reported on 01/13/2016) 90 capsule 3  . doxycycline (VIBRAMYCIN) 100 MG capsule Take 1 capsule (100 mg total) by mouth 2 (two) times daily. (Patient not taking: Reported on 01/13/2016) 20 capsule 0  . fluticasone (FLONASE) 50 MCG/ACT nasal spray Place 2 sprays into both nostrils daily. (Patient not taking: Reported on 01/13/2016) 16 g 6  . Guaifenesin (MUCINEX MAXIMUM STRENGTH) 1200 MG TB12 Take 1 tablet (1,200 mg total) by mouth every 12 (twelve) hours as needed. (Patient not taking: Reported on 06/25/2015) 14 tablet 1  . predniSONE (DELTASONE) 20 MG tablet Take 3 PO QAM x3days, 2 PO QAM x3days, 1 PO QAM x3days (Patient not taking: Reported on 06/25/2015) 18 tablet 0   No current facility-administered medications for this visit.    No Known Allergies  Social History   Social History  . Marital Status: Married    Spouse Name: N/A  . Number of Children: N/A  . Years of Education: N/A   Occupational History  . Not on file.   Social History Main Topics  . Smoking status: Current Every Day Smoker -- 0.50 packs/day    Types: Cigarettes  .  Smokeless tobacco: Not on file  . Alcohol Use: No  . Drug Use: No  . Sexual Activity: No   Other Topics Concern  . Not on file   Social History Narrative     Review of Systems: General: negative for chills, fever, night sweats or weight changes.  Cardiovascular: negative for chest pain, dyspnea on exertion, edema, orthopnea, palpitations, paroxysmal nocturnal dyspnea or shortness of breath Dermatological: negative for rash Respiratory: negative for cough or wheezing Urologic: negative for hematuria Abdominal: negative for nausea, vomiting, diarrhea, bright red blood per rectum, melena, or hematemesis Neurologic: negative for visual changes,  syncope, or dizziness All other systems reviewed and are otherwise negative except as noted above.    Blood pressure 122/79, pulse 73, height 4\' 11"  (1.499 m), weight 149 lb 12.8 oz (67.949 kg).  General appearance: alert and no distress Neck: no adenopathy, no carotid bruit, no JVD, supple, symmetrical, trachea midline and thyroid not enlarged, symmetric, no tenderness/mass/nodules Lungs: clear to auscultation bilaterally Heart: regular rate and rhythm, S1, S2 normal, no murmur, click, rub or gallop Extremities: extremities normal, atraumatic, no cyanosis or edema  EKG normal sinus rhythm at 73 without ST or T-wave changes. I personally reviewed this EKG  ASSESSMENT AND PLAN:   Hyperlipidemia History of hyperlipidemia on statin therapy with recent lipid profile performed 09/19/15 revealing total cholesterol of 176, LDL 74 and HDL of 55.  Tobacco abuse History of continued tobacco abuse of one half pack per day recalcitrant to risk factor modification      Runell GessJonathan J. Adorian Gwynne MD [facility] LLCFACP,FACC,FAHA, [facility]SCAI 01/13/2016 8:15 AM

## 2016-01-13 NOTE — Assessment & Plan Note (Signed)
History of continued tobacco abuse of one half pack per day recalcitrant to risk factor modification

## 2016-01-23 ENCOUNTER — Telehealth: Payer: Self-pay

## 2016-01-23 NOTE — Telephone Encounter (Signed)
[facility] is calling to get a refill on pt combivent but the patient has transferred from three pharmacies so they do not have anything onfile   Best number [phone]

## 2016-01-24 MED ORDER — IPRATROPIUM-ALBUTEROL 20-100 MCG/ACT IN AERS: INHALATION_SPRAY | RESPIRATORY_TRACT | Status: DC

## 2016-01-24 NOTE — Telephone Encounter (Signed)
Ok done

## 2016-02-08 ENCOUNTER — Other Ambulatory Visit: Payer: Self-pay | Admitting: Cardiovascular Disease

## 2016-02-08 MED ORDER — METOPROLOL SUCCINATE ER 25 MG PO TB24: 25.0000 mg | ORAL_TABLET | Freq: Every day | ORAL | 11 refills | Status: AC

## 2016-02-08 NOTE — Telephone Encounter (Signed)
°*  STAT* If patient is at the pharmacy, call can be transferred to refill team.   1. Which medications need to be refilled? (please list name of each medication and dose if known) Metoprolol Succinate  2. Which pharmacy/location (including street and city if local pharmacy) is medication to be sent to?[facility] on [address]   3. Do they need a 30 day or 90 day supply? 30   Needs a new prescription sent patient changed pharmacy

## 2016-02-08 NOTE — Telephone Encounter (Signed)
Rx(s) sent to pharmacy electronically.  

## 2016-02-09 ENCOUNTER — Other Ambulatory Visit: Payer: Self-pay | Admitting: Urgent Care

## 2016-02-27 ENCOUNTER — Ambulatory Visit (INDEPENDENT_AMBULATORY_CARE_PROVIDER_SITE_OTHER): Payer: BLUE CROSS/BLUE SHIELD | Admitting: Family Medicine

## 2016-02-27 ENCOUNTER — Encounter: Payer: Self-pay | Admitting: Family Medicine

## 2016-02-27 ENCOUNTER — Ambulatory Visit (INDEPENDENT_AMBULATORY_CARE_PROVIDER_SITE_OTHER): Payer: BLUE CROSS/BLUE SHIELD

## 2016-02-27 VITALS — BP 122/72 | HR 91 | Temp 97.9°F | Resp 17 | Ht 58.5 in | Wt 152.0 lb

## 2016-02-27 DIAGNOSIS — M25561 Pain in right knee: Secondary | ICD-10-CM

## 2016-02-27 DIAGNOSIS — M25562 Pain in left knee: Secondary | ICD-10-CM

## 2016-02-27 MED ORDER — PREDNISONE 20 MG PO TABS: ORAL_TABLET | ORAL | 0 refills | Status: DC

## 2016-02-27 NOTE — Progress Notes (Signed)
Patient ID: Huntley DecCynthia L Kenedy, female    DOB: 08-Sep-1960, 55 y.o.   MRN: [id number]  PCP: Martha ClanShaw, William, MD  Chief Complaint  Patient presents with  . Knee Pain    both knees     Subjective:   HPI Presents for evaluation of bilateral knee pain times 4 month. Reports pain has progressively increased and is more pronounced at work. She is on her feet for approximately 8 hours per day and after 6 hours of working, pain intensifies to 8/10. Ice packs seem relieve the pain. She hasn't taken any medication for pain. Pain doesn't intefer with sleep. Three days ago, she sat-down to use the bathroom and felt the sensation in her right knee "of something moving" and subsequently experienced sharp pain of the right knee.  After this occurred she was unable to flex knee completely. She continued to use ice and rested knee after work.   . Social History   Social History  . Marital status: Married    Spouse name: N/A  . Number of children: N/A  . Years of education: N/A   Occupational History  . Not on file.   Social History Main Topics  . Smoking status: Current Every Day Smoker    Packs/day: 0.50    Years: 38.00    Types: Cigarettes  . Smokeless tobacco: Never Used  . Alcohol use No  . Drug use: No  . Sexual activity: No   Other Topics Concern  . Not on file   Social History Narrative  . No narrative on file    . Family History  Problem Relation Age of Onset  . Heart disease Father   . Cancer Mother     Review of Systems  Constitutional: Negative.   Respiratory: Negative.   Cardiovascular: Negative.   Musculoskeletal: Positive for arthralgias and joint swelling.       See HPI    Patient Active Problem List   Diagnosis Date Noted  . Hyperlipidemia 01/11/2015  . Tobacco abuse 01/11/2015  . Family history of heart disease 01/11/2015     Prior to Admission medications   Medication Sig Start Date End Date Taking? Authorizing Provider  aspirin EC 81 MG tablet  Take 81 mg by mouth daily.   Yes Historical Provider, MD  atorvastatin (LIPITOR) 80 MG tablet Take 1 tablet (80 mg total) by mouth daily. 02/21/15  Yes Runell GessJonathan J Berry, MD  COMBIVENT RESPIMAT 20-100 MCG/ACT AERS respimat INHALE ONE PUFF BY MOUTH EVERY 6 HOURS AS NEEDED 02/10/16  Yes Wallis BambergMario Mani, PA-C  ezetimibe (ZETIA) 10 MG tablet Take 1 tablet (10 mg total) by mouth daily. 06/08/15  Yes Runell GessJonathan J Berry, MD  fish oil-omega-3 fatty acids 1000 MG capsule Take 2 g by mouth daily.   Yes Historical Provider, MD  metoprolol succinate (TOPROL-XL) 25 MG 24 hr tablet Take 1 tablet (25 mg total) by mouth daily. 02/08/16  Yes Runell GessJonathan J Berry, MD     No Known Allergies     Objective:  Physical Exam  Constitutional: She is oriented to person, place, and time. She appears well-developed and well-nourished.  HENT:  Head: Normocephalic and atraumatic.  Right Ear: External ear normal.  Eyes: Conjunctivae and EOM are normal. Pupils are equal, round, and reactive to light.  Neck: Normal range of motion. Neck supple.  Cardiovascular: Normal rate, regular rhythm and normal heart sounds.   Pulmonary/Chest: Effort normal and breath sounds normal.  Musculoskeletal: Normal range of motion.  Right knee +2  edema compared to left knee. Equal strength against resistance bilaterally Right and Left Knee -Medial and lateral meniscus intact evidenced by negative-McMurray Test  Negative anterior drawer test-bilateral knees  Neurological: She is alert and oriented to person, place, and time.  Skin: Skin is warm and dry.       Assessment & Plan:  1. Bilateral knee pain - DG Knee Complete 4 Views Left; Future - DG Knee Complete 4 Views Right; Future - AMB referral to orthopedics -Prednisone 20 mg Take 3 PO QAM x3days, 2 PO QAM x3days, 1 PO QAM x3 days -Take (2) 325 mg Tylenol as needed for pain.  Godfrey PickKimberly S. Tiburcio PeaHarris, MSN, FNP-C [facility]

## 2016-02-27 NOTE — Patient Instructions (Addendum)
Orthopedics will contact you to schedule an appointment. Start prednisone in AM Take 3 pills in morning  x3days, 2 pills in morning x3days, 1 pill in morning x3days May continue to use ice or take Tylenol for pain.   IF you received an x-ray today, you will receive an invoice from [facility]. Please contact [facility] at [phone] with questions or concerns regarding your invoice.   IF you received labwork today, you will receive an invoice from [facility]. Please contact [facility] at [phone] with questions or concerns regarding your invoice.   Our billing staff will not be able to assist you with questions regarding bills from these companies.  You will be contacted with the lab results as soon as they are available. The fastest way to get your results is to activate your My Chart account. Instructions are located on the last page of this paperwork. If you have not heard from us regarding the results in 2 weeks, please contact this office.    We recommend that you schedule a mammogram for breast cancer screening. Typically, you do not need a referral to do this. Please contact a local imaging center to schedule your mammogram.  [facility] - [phone]  *ask for the Radiology Department [facility] - [phone] or [phone]  [facility] - [phone](336) [phone] [facility] - [phone] [facility] - [phone]) [phone]  *ask for the Radiology Department [facility] - [phone]) [phone]  *ask for the Radiology Department [facility] - [phone]  *ask for the Mammography Department [facility] - [phone](336) [phone]   Knee Pain Knee pain is a common problem. It can have many causes. The pain often goes away by following your doctor's home care instructions. Treatment for ongoing pain will depend on the cause of your pain. If your knee pain  continues, more tests may be needed to diagnose your condition. Tests may include X-rays or other imaging studies of your knee. HOME CARE  Take medicines only as told by your doctor.  Rest your knee and keep it raised (elevated) while you are resting.  Do not do things that cause pain or make your pain worse.  Avoid activities where both feet leave the ground at the same time, such as running, jumping rope, or doing jumping jacks.  Apply ice to the knee area:  Put ice in a plastic bag.  Place a towel between your skin and the bag.  Leave the ice on for 20 minutes, 2-3 times a day.  Ask your doctor if you should wear an elastic knee support.  Sleep with a pillow under your knee.  Lose weight if you are overweight. Being overweight can make your knee hurt more.  Do not use any tobacco products, including cigarettes, chewing tobacco, or electronic cigarettes. If you need help quitting, ask your doctor. Smoking may slow the healing of any bone and joint problems that you may have. GET HELP IF:  Your knee pain does not stop, it changes, or it gets worse.  You have a fever along with knee pain.  Your knee gives out or locks up.  Your knee becomes more swollen. GET HELP RIGHT AWAY IF:   Your knee feels hot to the touch.  You have chest pain or trouble breathing.   This information is not intended to replace advice given to you by your health care provider. Make sure you discuss any questions  you have with your health care provider.   Document Released: 09/28/2008 Document Revised: 07/23/2014 Document Reviewed: 09/02/2013 Elsevier Interactive Patient Education [employer]2016 Elsevier Inc.

## 2016-02-28 ENCOUNTER — Encounter: Payer: Self-pay | Admitting: Family Medicine

## 2016-02-28 ENCOUNTER — Telehealth: Payer: Self-pay

## 2016-02-28 NOTE — Telephone Encounter (Signed)
Pt is needing Angelica [name] to call her to day about her situation about her job  Best number [phone]

## 2016-02-29 ENCOUNTER — Other Ambulatory Visit: Payer: Self-pay | Admitting: Cardiovascular Disease

## 2016-02-29 ENCOUNTER — Other Ambulatory Visit: Payer: Self-pay | Admitting: Urgent Care

## 2016-02-29 NOTE — Telephone Encounter (Signed)
Rx(s) sent to pharmacy electronically.  

## 2016-03-02 NOTE — Telephone Encounter (Signed)
Patient notified and will stop by tomorrow.

## 2016-03-02 NOTE — Telephone Encounter (Signed)
Please contact patient and advise to come by the office tomorrow after 1:00 and [name] up paperwork.  Angelica PickKimberly S. Tiburcio PeaHarris, MSN, FNP-C [facility]

## 2016-03-02 NOTE — Telephone Encounter (Signed)
Pt states she dropped off paperwork for Joaquin CourtsKimberly Harris to fill out on 03/01/16. She needs this done by 03/03/16, so she can turn this in by Sunday morning to her job. Please advise at [phone]

## 2016-03-18 ENCOUNTER — Other Ambulatory Visit: Payer: Self-pay | Admitting: Family Medicine

## 2016-04-01 ENCOUNTER — Other Ambulatory Visit: Payer: Self-pay | Admitting: Family Medicine

## 2016-04-04 ENCOUNTER — Ambulatory Visit (INDEPENDENT_AMBULATORY_CARE_PROVIDER_SITE_OTHER): Payer: BLUE CROSS/BLUE SHIELD | Admitting: Physician Assistant

## 2016-04-04 ENCOUNTER — Encounter: Payer: Self-pay | Admitting: Physician Assistant

## 2016-04-04 VITALS — BP 102/56 | HR 66 | Temp 98.1°F | Resp 16 | Ht <= 58 in | Wt 156.0 lb

## 2016-04-04 DIAGNOSIS — Z720 Tobacco use: Secondary | ICD-10-CM | POA: Diagnosis not present

## 2016-04-04 DIAGNOSIS — J449 Chronic obstructive pulmonary disease, unspecified: Secondary | ICD-10-CM | POA: Diagnosis not present

## 2016-04-04 MED ORDER — IPRATROPIUM-ALBUTEROL 20-100 MCG/ACT IN AERS: 1.0000 | INHALATION_SPRAY | Freq: Four times a day (QID) | RESPIRATORY_TRACT | 11 refills | Status: AC | PRN

## 2016-04-04 MED ORDER — MOMETASONE FURO-FORMOTEROL FUM 100-5 MCG/ACT IN AERO: 2.0000 | INHALATION_SPRAY | Freq: Two times a day (BID) | RESPIRATORY_TRACT | 11 refills | Status: AC

## 2016-04-04 NOTE — Patient Instructions (Signed)
     IF you received an x-ray today, you will receive an invoice from [facility]. Please contact [facility] at [phone] with questions or concerns regarding your invoice.   IF you received labwork today, you will receive an invoice from [facility]. Please contact [facility] at [phone] with questions or concerns regarding your invoice.   Our billing staff will not be able to assist you with questions regarding bills from these companies.  You will be contacted with the lab results as soon as they are available. The fastest way to get your results is to activate your My Chart account. Instructions are located on the last page of this paperwork. If you have not heard from us regarding the results in 2 weeks, please contact this office.      

## 2016-04-04 NOTE — Progress Notes (Signed)
Patient ID: Angelica [name], female   DOB: Dec 14, 1960, 55 y.o.   MRN: [id number] [facility] [address] [place] [address] [phone]  By signing my name below I, Angelica [name], attest that this documentation has been prepared under the direction and in the presence of Trena Platt PA. Electonically Signed. Angelica [name], Scribe 04/04/2016 at 8:52 AM  Date:  04/04/2016   Name:  Angelica [name]   DOB:  Nov 22, 1960   MRN:  [id number]  PCP:  Martha Clan, MD    History of Present Illness:  Angelica [name] is a 55 y.o. female patient who presents to [facility] for medication refill for her combivent refill. Pt states that she uses her inhaler every 6 hours. Pt states she needs it while at work because she works in a [employer] and breathing in lots of steam.  Pt is an active smoker. Pt denies history of COPD or asthma. Pt states that she has been smoking less and is down to half a pack a day.  Pt reports that she is otherwise healthy.    Patient Active Problem List   Diagnosis Date Noted   Hyperlipidemia 01/11/2015   Tobacco abuse 01/11/2015   Family history of heart disease 01/11/2015    Past Medical History:  Diagnosis Date   Hyperlipidemia    Murmur    Tobacco use     Past Surgical History:  Procedure Laterality Date   CARDIOVASCULAR STRESS TEST  04/07/2010   EKG negative for ischemia. No significant ischemia demonstrated.   COLONOSCOPY     TRANSTHORACIC ECHOCARDIOGRAM  11/06/2011   EF >55%, normal    Social History  Substance Use Topics   Smoking status: Current Every Day Smoker    Packs/day: 0.50    Years: 38.00    Types: Cigarettes   Smokeless tobacco: Never Used   Alcohol use No    Family History  Problem Relation Age of Onset   Heart disease Father    Cancer Mother     No Known Allergies  Medication list has been reviewed and updated.  Current Outpatient Prescriptions on File Prior to Visit  Medication Sig  Dispense Refill   aspirin EC 81 MG tablet Take 81 mg by mouth daily.     atorvastatin (LIPITOR) 80 MG tablet TAKE ONE TABLET BY MOUTH ONCE DAILY 30 tablet 6   ezetimibe (ZETIA) 10 MG tablet Take 1 tablet (10 mg total) by mouth daily. 30 tablet 12   fish oil-omega-3 fatty acids 1000 MG capsule Take 2 g by mouth daily.     Ipratropium-Albuterol (COMBIVENT RESPIMAT) 20-100 MCG/ACT AERS respimat Inhale 1 puff into the lungs every 6 (six) hours as needed. 1 Inhaler 0   metoprolol succinate (TOPROL-XL) 25 MG 24 hr tablet Take 1 tablet (25 mg total) by mouth daily. 30 tablet 11   No current facility-administered medications on file prior to visit.     ROS ROS unremarkable unless otherwise specified.  Physical Examination: BP (!) 102/56    Pulse 66    Temp 98.1 F (36.7 C) (Oral)    Resp 16    Ht 4\' 10"  (1.473 m)    Wt 156 lb (70.8 kg)    SpO2 94%    BMI 32.60 kg/m  Ideal Body Weight: @FLOWAMB ([id number])@  Physical Exam  Constitutional: She is oriented to person, place, and time. She appears well-developed and well-nourished. No distress.  HENT:  Head: Normocephalic and atraumatic.  Right Ear:  External ear normal.  Left Ear: External ear normal.  Eyes: Conjunctivae and EOM are normal. Pupils are equal, round, and reactive to light.  Cardiovascular: Normal rate, regular rhythm and normal heart sounds.  Exam reveals no gallop and no friction rub.   No murmur heard. Pulmonary/Chest: Effort normal and breath sounds normal. No respiratory distress. She has no decreased breath sounds. She has no wheezes. She has no rhonchi. She has no rales.  Neurological: She is alert and oriented to person, place, and time.  Skin: She is not diaphoretic.  Psychiatric: She has a normal mood and affect. Her behavior is normal.     Assessment and Plan: Angelica [name] is a 55 y.o. female who is here today for medicatin refill.   --refill given to patient.  We will also have a trial run of the dulera.   We will see if this lowers the amount of combivent usage.  She will return in 3 months.   Trena PlattStephanie English, PA-C [facility] 9/21/20177:38 AM   Trena PlattStephanie English, PA-C [facility] 04/04/2016 8:21 AM  I personally performed the services described in this documentation, which was scribed in my presence. The recorded information has been reviewed and is accurate.

## 2016-06-22 ENCOUNTER — Other Ambulatory Visit: Payer: Self-pay | Admitting: Cardiovascular Disease

## 2016-06-22 DIAGNOSIS — E785 Hyperlipidemia, unspecified: Secondary | ICD-10-CM

## 2018-03-30 IMAGING — DX DG KNEE COMPLETE 4+V*L*
4 series · 4 of 4 positions shown · non-contrast
Comparison: None.

CLINICAL DATA: Bilateral knee pain.

EXAM:
LEFT KNEE - COMPLETE 4+ VIEW

[knee ap]
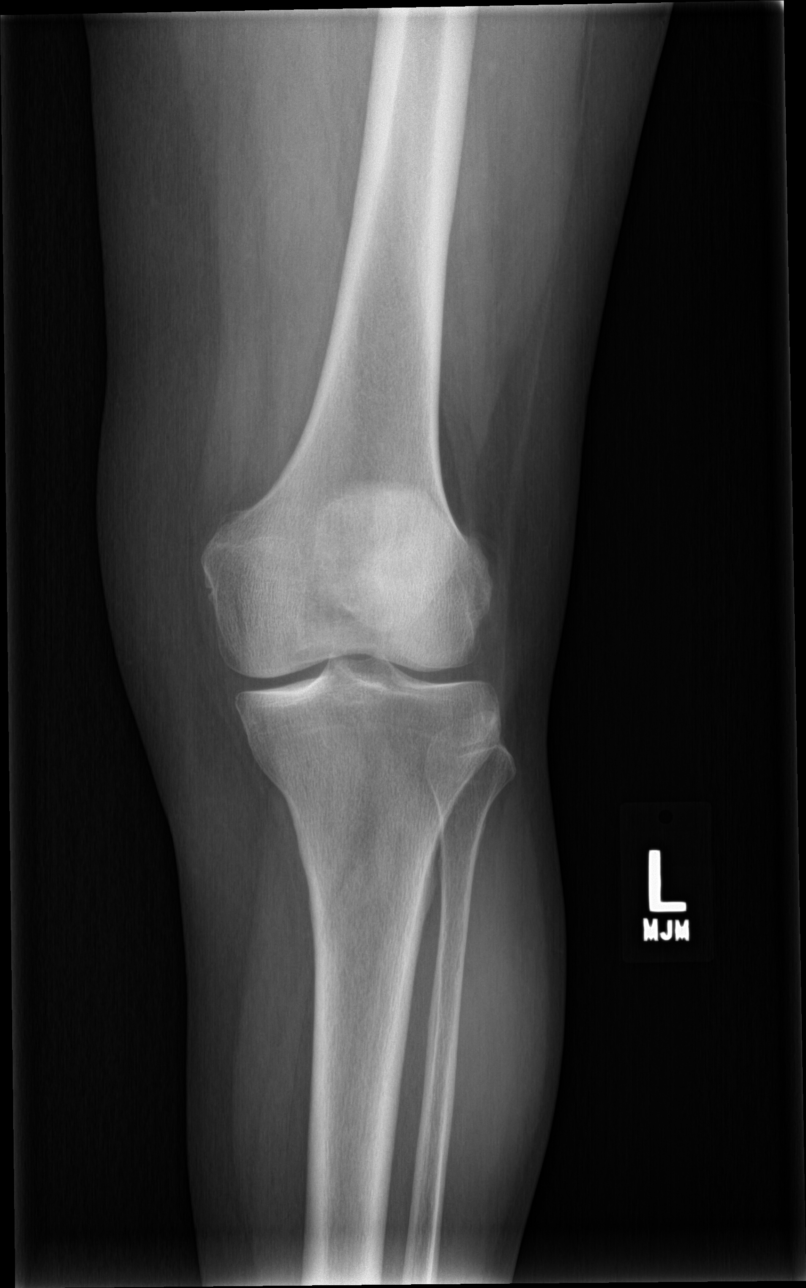

[knee obl (1 of 2)]
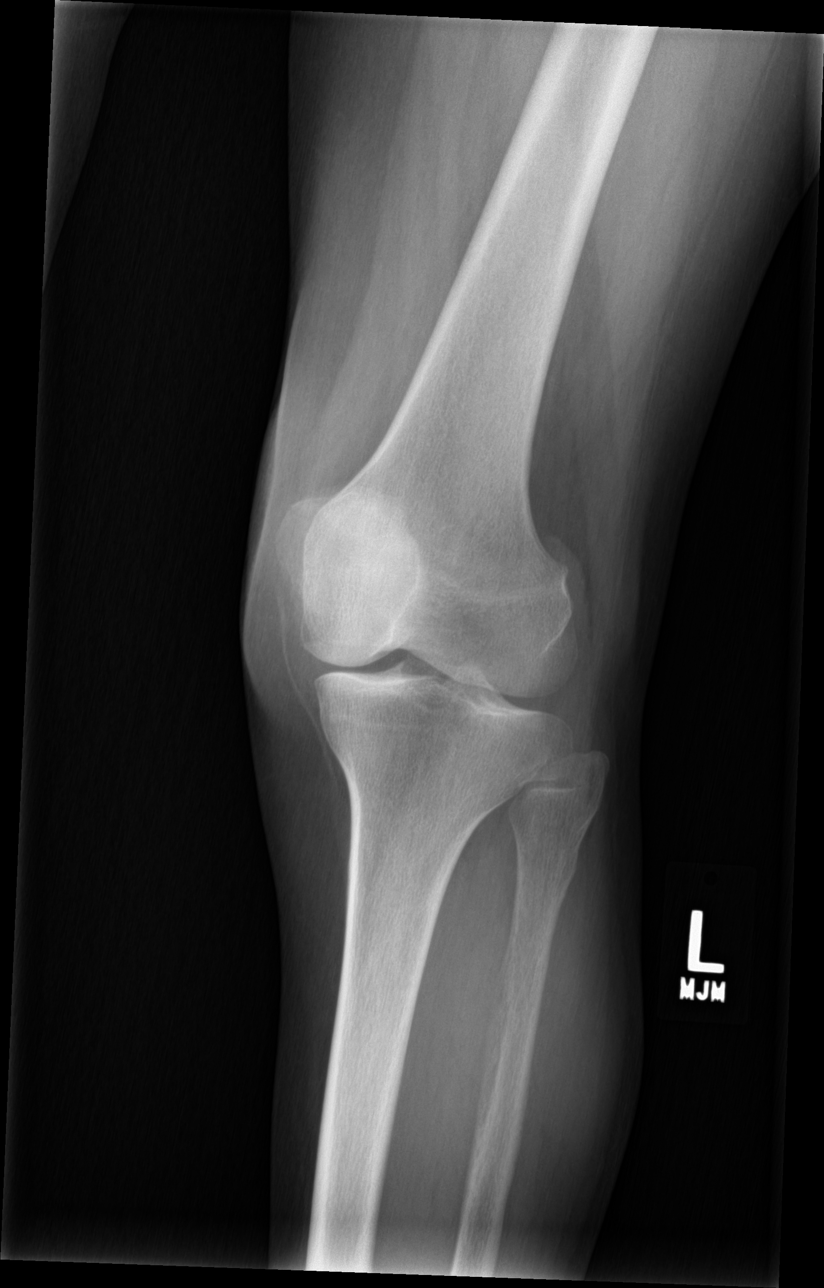

[knee obl (2 of 2)]
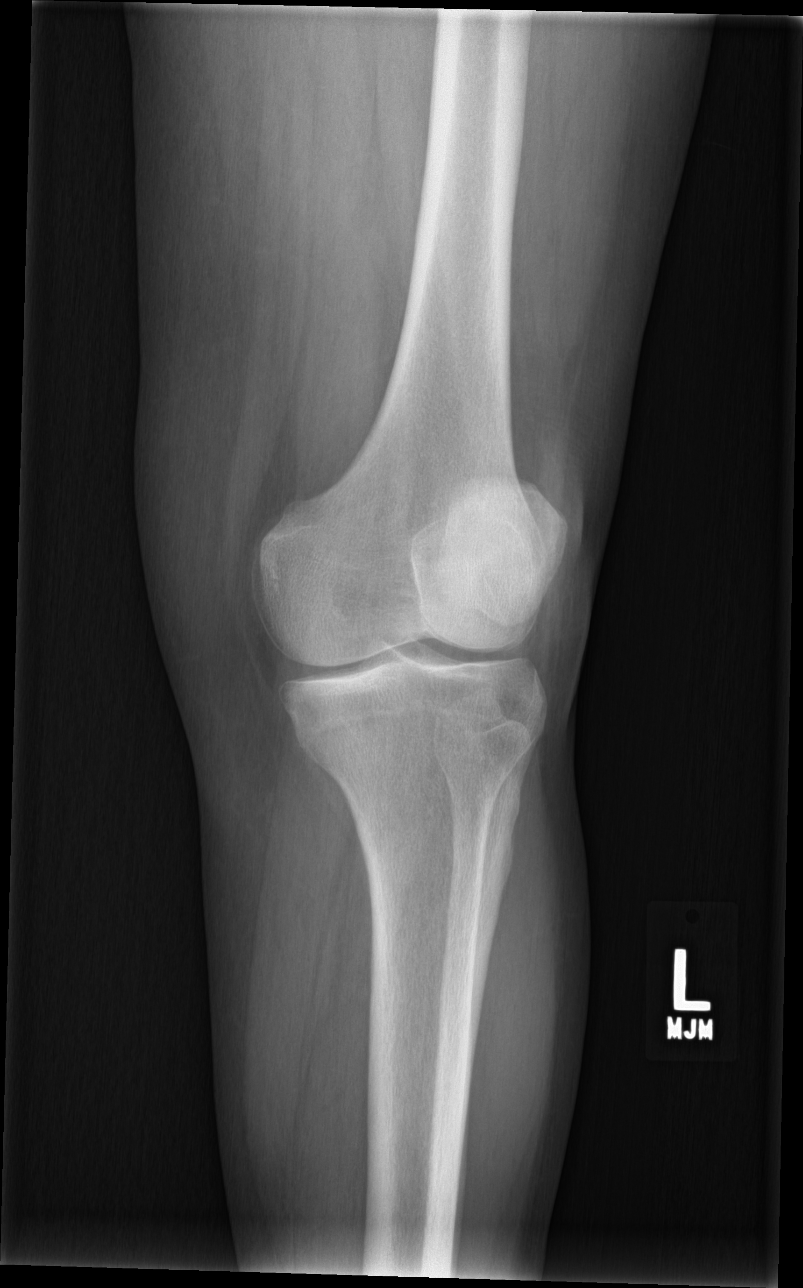

[knee lat]
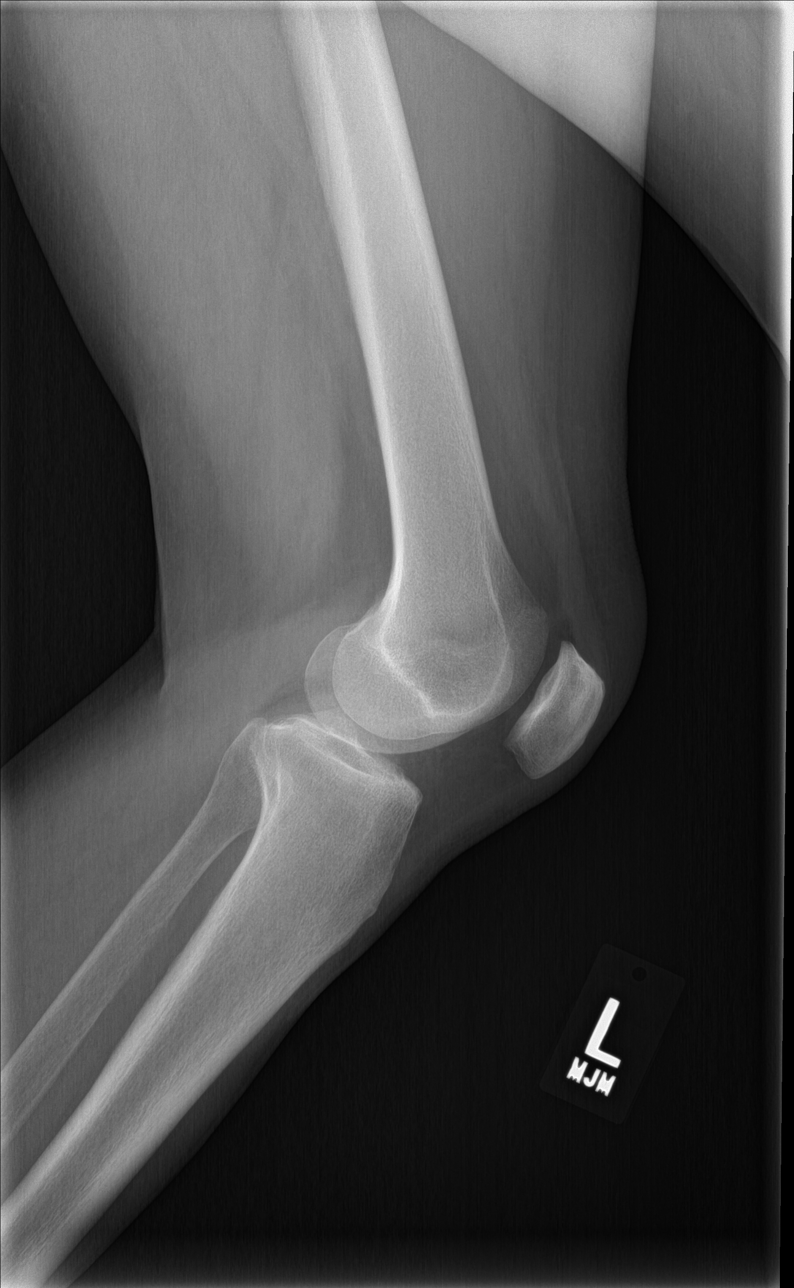

[4 of 4 positions shown; findings below may reference images not displayed]

FINDINGS: No acute bony or joint abnormality. No evidence of fracture or
dislocation.
IMPRESSION: No acute or focal abnormality.

## 2020-07-23 ENCOUNTER — Other Ambulatory Visit: Payer: Self-pay

## 2020-07-23 ENCOUNTER — Encounter (HOSPITAL_COMMUNITY): Payer: Self-pay

## 2020-07-23 ENCOUNTER — Emergency Department (HOSPITAL_COMMUNITY)
Admission: EM | Admit: 2020-07-23 | Discharge: 2020-07-23 | Disposition: A | Discharge status: Home / Self Care | Payer: 59 | Attending: Emergency Medicine | Admitting: Emergency Medicine

## 2020-07-23 DIAGNOSIS — F17210 Nicotine dependence, cigarettes, uncomplicated: Secondary | ICD-10-CM | POA: Insufficient documentation

## 2020-07-23 DIAGNOSIS — R630 Anorexia: Secondary | ICD-10-CM | POA: Diagnosis not present

## 2020-07-23 DIAGNOSIS — R059 Cough, unspecified: Secondary | ICD-10-CM | POA: Insufficient documentation

## 2020-07-23 DIAGNOSIS — Z20822 Contact with and (suspected) exposure to covid-19: Secondary | ICD-10-CM | POA: Diagnosis not present

## 2020-07-23 DIAGNOSIS — Z7982 Long term (current) use of aspirin: Secondary | ICD-10-CM | POA: Insufficient documentation

## 2020-07-23 DIAGNOSIS — R519 Headache, unspecified: Secondary | ICD-10-CM | POA: Insufficient documentation

## 2020-07-23 DIAGNOSIS — R300 Dysuria: Secondary | ICD-10-CM | POA: Diagnosis not present

## 2020-07-23 DIAGNOSIS — R509 Fever, unspecified: Secondary | ICD-10-CM | POA: Insufficient documentation

## 2020-07-23 DIAGNOSIS — Z79899 Other long term (current) drug therapy: Secondary | ICD-10-CM | POA: Diagnosis not present

## 2020-07-23 DIAGNOSIS — B349 Viral infection, unspecified: Secondary | ICD-10-CM

## 2020-07-23 DIAGNOSIS — J3489 Other specified disorders of nose and nasal sinuses: Secondary | ICD-10-CM | POA: Insufficient documentation

## 2020-07-23 DIAGNOSIS — R0981 Nasal congestion: Secondary | ICD-10-CM | POA: Diagnosis not present

## 2020-07-23 LAB — SARS CORONAVIRUS 2 (TAT 6-24 HRS): SARS Coronavirus 2: NEGATIVE

## 2020-07-23 MED ORDER — ACETAMINOPHEN 325 MG PO TABS
650.0000 mg | ORAL_TABLET | Freq: Once | ORAL | Status: AC
  Administered 2020-07-23: 650 mg via ORAL
  Filled 2020-07-23: qty 2

## 2020-07-23 NOTE — ED Notes (Signed)
Pt discharged from this ED in stable condition at this time. All discharge instructions and follow up care reviewed with pt with no further questions at this time. Pt ambulatory with steady gait, clear speech.  

## 2020-07-23 NOTE — Discharge Instructions (Addendum)
Concern for COVID-19.  You have been tested here in the ED and should have your results in the next 12-18 hours.    Please maintain isolation precautions.  Check your temperature regularly and take Tylenol as needed for fever control.  Increase your oral hydration and continue to eat regular meals.  I recommend over-the-counter medications as needed for symptom relief.    Follow-up with your primary care provider regarding today's encounter and for ongoing management.  If you do not have one, please get established with one as soon as possible.  If you do not have insurance, please consider the [facility].  If you develop any recurrence of your urinary symptoms, fevers or chills, abdominal pain, or flank pain, your symptoms may be suggestive of ascending urinary tract infection given your urinary discomfort last week.  This would need to be treated with antibiotics.  Given that you were unable to provide [place] with a urine analysis/culture, I would like for you to notify your primary care provider of today's encounter and try to provide them with a urine sample/culture on outpatient basis.    Return to the ED or seek immediate medical attention should you experience any new or worsening symptoms.

## 2020-07-23 NOTE — ED Provider Notes (Signed)
[facility]-EMERGENCY DEPT Provider Note   CSN: [id number] Arrival date & time: 07/23/20  2122     History Chief Complaint  Patient presents with   Generalized Body Aches    Angelica [name] is a 60 y.o. female with PMH of HTN and HLD who arrives to the ED with complaints of congestion, fevers and chills, headache, congestion, mild nonproductive cough, and diminished appetite.  Patient reported that on 07/17/20 she developed mild dysuria and was advised by her daughter to take Azo for her symptoms. She states that they improved, however a few days later, approximately 07/20/2020, she developed nasal congestion, rhinorrhea, headache, nonproductive cough, diminished appetite, and feeling "hot and cold". She states that she has not felt this way and many years. She was fully immunized for COVID-19, but did not receive her booster injection. 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    She has a 10-pack-year smoking history but discontinued last year.    HPI     Past Medical History:  Diagnosis Date   Hyperlipidemia    Murmur    Tobacco use     Patient Active Problem List   Diagnosis Date Noted   Hyperlipidemia 01/11/2015   Tobacco abuse 01/11/2015   Family history of heart disease 01/11/2015    Past Surgical History:  Procedure Laterality Date   CARDIOVASCULAR STRESS TEST  04/07/2010   EKG negative for ischemia. No significant ischemia demonstrated.   COLONOSCOPY     TRANSTHORACIC ECHOCARDIOGRAM  11/06/2011   EF >55%, normal     OB History   No obstetric history on file.     Family History  Problem Relation Age of Onset   Heart disease Father    Cancer Mother     Social History   Tobacco Use   Smoking status: Current Every Day Smoker    Packs/day: 0.50    Years: 38.00    Pack years: 19.00    Types: Cigarettes   Smokeless tobacco: Never Used   Substance Use Topics   Alcohol use: No   Drug use: No    Home Medications Prior to Admission medications   Medication Sig Start Date End Date Taking? Authorizing Provider  aspirin EC 81 MG tablet Take 81 mg by mouth daily.    [provider]  atorvastatin (LIPITOR) 80 MG tablet TAKE ONE TABLET BY MOUTH ONCE DAILY 02/29/16   Runell Gess, MD  ezetimibe (ZETIA) 10 MG tablet TAKE ONE TABLET BY MOUTH ONCE DAILY 06/25/16   Runell Gess, MD  fenofibrate 160 MG tablet TAKE ONE TABLET BY MOUTH ONCE DAILY 06/25/16   Runell Gess, MD  fenofibrate 54 MG tablet Take 54 mg by mouth daily.    [provider]  fish oil-omega-3 fatty acids 1000 MG capsule Take 2 g by mouth daily.    [provider]  Ipratropium-Albuterol (COMBIVENT RESPIMAT) 20-100 MCG/ACT AERS respimat Inhale 1 puff into the lungs every 6 (six) hours as needed. 04/04/16   Trena Platt D, PA  metoprolol succinate (TOPROL-XL) 25 MG 24 hr tablet Take 1 tablet (25 mg total) by mouth daily. 02/08/16   Runell Gess, MD  mometasone-formoterol (DULERA) 100-5 MCG/ACT AERO Inhale 2 puffs into the lungs 2 (two) times daily. 04/04/16   Trena Platt D, PA    Allergies    Patient has no known allergies.  Review of  Systems   Review of Systems  Constitutional: Positive for chills and fever.  HENT: Positive for congestion.   Respiratory: Positive for cough and shortness of breath.   Cardiovascular: Negative for chest pain and leg swelling.  Gastrointestinal: Negative for abdominal pain, nausea and vomiting.  Genitourinary: Negative for difficulty urinating, dysuria, frequency, vaginal bleeding, vaginal discharge and vaginal pain.  Musculoskeletal: Positive for myalgias.  Neurological: Positive for headaches. Negative for dizziness and numbness.    Physical Exam Updated Vital Signs BP 126/64    Pulse (!) 107    Temp 99.8 F (37.7 C) (Oral)    Resp 18    Ht 4\' 10"  (1.473 m)    Wt 70.8 kg     SpO2 99%    BMI 32.60 kg/m   Physical Exam Vitals and nursing note reviewed. Exam conducted with a chaperone present.  Constitutional:      General: She is not in acute distress.    Appearance: She is ill-appearing. She is not toxic-appearing.  HENT:     Head: Normocephalic and atraumatic.  Eyes:     General: No scleral icterus.    Conjunctiva/sclera: Conjunctivae normal.  Cardiovascular:     Rate and Rhythm: Regular rhythm. Tachycardia present.     Pulses: Normal pulses.  Pulmonary:     Effort: Pulmonary effort is normal. No respiratory distress.     Breath sounds: Normal breath sounds. No wheezing or rales.     Comments: No increased work of breathing.  Even, unlabored.  99% on RA.  CTA bilaterally. Abdominal:     General: Abdomen is flat. There is no distension.     Palpations: Abdomen is soft.     Tenderness: There is no abdominal tenderness. There is no guarding.     Comments: No suprapubic tenderness or CVAT.  Musculoskeletal:     Right lower leg: No edema.     Left lower leg: No edema.  Skin:    General: Skin is dry.  Neurological:     General: No focal deficit present.     Mental Status: She is alert and oriented to person, place, and time.     GCS: GCS eye subscore is 4. GCS verbal subscore is 5. GCS motor subscore is 6.  Psychiatric:        Mood and Affect: Mood normal.        Behavior: Behavior normal.        Thought Content: Thought content normal.     ED Results / Procedures / Treatments   Labs (all labs ordered are listed, but only abnormal results are displayed) Labs Reviewed  SARS CORONAVIRUS 2 (TAT 6-24 HRS)    EKG None  Radiology No results found.  Procedures Procedures (including critical care time)  Medications Ordered in ED Medications  acetaminophen (TYLENOL) tablet 650 mg (650 mg Oral Given 07/23/20 1313)    ED Course  I have reviewed the triage vital signs and the nursing notes.  Pertinent labs & imaging results that were  available during my care of the patient were reviewed by me and considered in my medical decision making (see chart for details).    MDM Rules/Calculators/A&P                          While patient had noted dysuria 1 week ago, she states that her symptoms entirely resolved with Azo and prior to developing symptoms suggestive of viral illness.  Currently, she denies any  dysuria, increased urinary frequency, abdominal pain, nausea or emesis, or flank pain.  While she is febrile here in the ED and meets SIRS criteria, given her congestion, cough, HA, and diminished appetite in context of no urinary symptoms leads me to suspect that this is viral.  She is endorsing congestion, cough, headache, and generalized myalgias.  While she denies any obvious sick contacts, she understands that COVID-19 is spreading through the community rapidly and she suspects that she may have had an exposure while at work where young individuals do not take appropriate precautions.  COVID-19 6 to 24-hour test is pending.  She is fully immunized, although did not receive her booster injection.  Regardless, she is lower risk when compared to many of her peers.  She is approximately 4-5 days into her course of illness.  Patient was unable to provide [place] with a urine sample as she had voided immediately prior to being asked to provide sample.  Given lack of significant cough in conjunction with her normal respiratory rate and clear to auscultation physical exam, do not feel as though chest x-ray is emergently warranted.  Patient agrees.  While she is endorsing diminished appetite, she is still eating and drinking without too much difficulty.  Patient is tolerating food and liquid without difficulty and I do not believe that laboratory work-up would yield any significant findings.  Symptoms are likely of viral etiology.  Discussed that antibiotics are not indicated for viral infections.  Patient will be discharged with symptomatic  treatment.  I emphasized the importance of rest, continued oral hydration, and antipyretics as needed for fever control.    They were provided opportunity to ask any additional questions and have none at this time.  Prior to discharge patient is feeling well, agreeable with plan for discharge home.  They have expressed understanding of verbal discharge instructions as well as return precautions and are agreeable to the plan.   Angelica [name] was evaluated in Emergency Department on 07/23/2020 for the symptoms described in the history of present illness. She was evaluated in the context of the global COVID-19 pandemic, which necessitated consideration that the patient might be at risk for infection with the SARS-CoV-2 virus that causes COVID-19. Institutional protocols and algorithms that pertain to the evaluation of patients at risk for COVID-19 are in a state of rapid change based on information released by regulatory bodies including the CDC and federal and state organizations. These policies and algorithms were followed during the patient's care in the ED.  Final Clinical Impression(s) / ED Diagnoses Final diagnoses:  Viral illness    Rx / DC Orders ED Discharge Orders    None       Lorelee New, PA-C 07/23/20 1325    Lorre Nick, MD 07/24/20 [phone]

## 2020-07-23 NOTE — ED Triage Notes (Signed)
Pt arrives with c/o dysuria. Reports taking "azo" and it resolved. Pt also sts generalized body aches, chills, headaches, and lack of appetite since Sunday.

## 2024-04-10 NOTE — Telephone Encounter (Signed)
Ok - waiting on letter

## 2024-04-15 NOTE — Telephone Encounter (Signed)
 Have already sent this in

## 2024-04-21 NOTE — Progress Notes (Signed)
 [address] - AMBULATORY [facility]  - FAMILY MEDICINE [facility] [address] [place] [address]    Date of Service: 04/21/2024 Patient Name: Angelica [name] [facility] Patient DOB: Dec 06, 1960    Assessment and Plan:  Shaguana was seen today for back pain.  Diagnoses and all orders for this visit:  Facet arthropathy, lumbar -     Ambulatory referral to Physical Therapy; Future -     Ambulatory referral to Spine Surgery; Future -     triamcinolone acetonide (KENALOG-40) 40 mg/mL injection 40 mg  Degeneration of intervertebral disc of lumbar region with discogenic back pain and lower extremity pain -     Ambulatory referral to Physical Therapy; Future -     Ambulatory referral to Spine Surgery; Future -     methylPREDNISolone (MEDROL DOSEPAK) 4 mg 6 day dose pack; Take As Directed On Package -     celecoxib (CeleBREX) 200 mg capsule; Take 1 capsule (200 mg total) by mouth daily.  Chronic right-sided low back pain with right-sided sciatica -     Ambulatory referral to Physical Therapy; Future -     Ambulatory referral to Spine Surgery; Future -     celecoxib (CeleBREX) 200 mg capsule; Take 1 capsule (200 mg total) by mouth daily.    She verbalizes understanding and agreement of her current diagnoses, medications and therapies. All questions answered.   Medication side effects discussed with patient. Advised patient to call clinic or return for visit if these symptoms occur. The patient does not  have any barriers to taking medications as prescribed. Goals of care discussed with patient including medication adherence and adequate follow up Relevant barriers identified and addressed: none.   Results for orders placed or performed in visit on 12/17/23  Hemoglobin A1C With Estimated Average Glucose   Collection Time: 12/17/23  4:37 PM  Result Value Ref Range   Hemoglobin A1c 6.4 (H) <5.7 %   Estimated Average Glucose 137 mg/dL   Comprehensive Metabolic Panel   Collection Time: 12/17/23  4:37 PM  Result Value Ref Range   Sodium 141 136 - 145 mmol/L   Potassium 4.2 3.5 - 5.1 mmol/L   Chloride 102 98 - 107 mmol/L   CO2 30 21 - 31 mmol/L   Anion Gap 9 6 - 14 mmol/L   Glucose, Random 119 (H) 70 - 99 mg/dL   Blood Urea Nitrogen (BUN) 18 7 - 25 mg/dL   Creatinine 9.22 9.39 - 1.20 mg/dL   eGFR 86 >40 fO/fpw/8.26f7   Albumin 4.5 3.5 - 5.7 g/dL   Total Protein 7.5 6.4 - 8.9 g/dL   Bilirubin, Total 0.5 0.3 - 1.0 mg/dL   Alkaline Phosphatase (ALP) 117 (H) 34 - 104 U/L   Aspartate Aminotransferase (AST) 15 13 - 39 U/L   Alanine Aminotransferase (ALT) 15 7 - 52 U/L   Calcium  9.5 8.6 - 10.3 mg/dL   BUN/Creatinine Ratio    CBC with Differential   Collection Time: 12/17/23  4:37 PM  Result Value Ref Range   WBC 16.70 (H) 4.40 - 11.00 10*3/uL   RBC 4.42 4.10 - 5.10 10*6/uL   Hemoglobin 12.8 12.3 - 15.3 g/dL   Hematocrit 60.8 64.0 - 44.6 %   Mean Corpuscular Volume (MCV) 88.5 80.0 - 96.0 fL   Mean Corpuscular Hemoglobin (MCH) 28.9 27.5 - 33.2 pg   Mean Corpuscular Hemoglobin Conc (MCHC) 32.7 (L) 33.0 - 37.0 g/dL   Red Cell  Distribution Width (RDW) 15.4 12.3 - 17.0 %   Platelet Count (PLT) 348 150 - 450 10*3/uL   Mean Platelet Volume (MPV) 7.4 6.8 - 10.2 fL   Neutrophils % 76 %   Lymphocytes % 15 %   Monocytes % 7 %   Eosinophils % 1 %   Basophils % 1 %   nRBC % 0 %   Neutrophils Absolute 12.70 (H) 1.80 - 7.80 10*3/uL   Lymphocytes # 2.50 1.00 - 4.80 10*3/uL   Monocytes # 1.20 (H) 0.00 - 0.80 10*3/uL   Eosinophils # 0.20 0.00 - 0.50 10*3/uL   Basophils # 0.10 0.00 - 0.20 10*3/uL   nRBC Absolute 0.00 <=0.00 10*3/uL   Return if symptoms worsen or fail to improve. Electronically signed by: Elsie Fairy Favorite, PA-C 04/21/2024 10:08 AM   HPI:   Angelica [name] is a 63 y.o. female presents with Back Pain (Pt is here for back pain she has been having for over 1 month. Pt was previously seen by Santana and was  given robaxin and a course of prednisone . Pt also had a x-ray done that has not been read by penny. Pt states the meds that were given did not help. Pt also took ibuprfen and icey hot but they have not helped. Pt is having a lot of difficulty in the mornings getting ready for work. Pt has been missing work due to this back pain. Pt states the pain is in her lower back/hip on the left side. )    ROS:  Constitutional symptoms: negative Eyes:  negative Ear, nose, throat:  negative Cardiovascular:  negative Respiratory:  negative Gastrointestinal:  negative Genitourinary:  negative Skin:  negative Neurological:  negative Musculoskeletal:  Low back pain Psychiatric:  negative Endocrine:  negative Hematological:  negative Allergic:  negative  Objective:   Vitals:   04/21/24 0950 04/21/24 0952  BP: 147/76 136/82  BP Location: Left arm Left arm  Patient Position: Sitting Sitting  Pulse: 80   Temp: 97.4 F (36.3 C)   TempSrc: Temporal   SpO2: 99%   Weight: 86.5 kg (190 lb 9.6 oz)   Height: 1.499 m (4' 11)     Constitutional: Morbidly obese slightly older female. Sitting comfortably conversing normally. Pleasant.  Cardiovascular: +S1S2, regular rate and rhythm, no murmurs, gallops or rubs appreciated.  Respiratory: Normal effort, clear to auscultation bilaterally. No wheezes, rales or rhonchi noted.  Back: No CVA tenderness bilaterally. No ttp. Pain on ambulation mostly and in am on first movements  Allergies Patient has no known allergies.  Current Medications[1] Problem List[2] Surgical History[3] Family History[4] Social History[5] Tobacco Use History[6]   I have reviewed and (if needed) updated the patient's problem list, medications, allergies, past medical and surgical history, social and family history. Health Maintenance Status       Date Due Completion Dates   Diabetes:  Quantitative uACR for Kidney Evaluation Never done ---   ZOSTER VACCINE (2 of 2) 11/18/2023  09/23/2023   Influenza Vaccine (1) 02/14/2024 03/26/2023, 03/26/2023   Diabetes: Retinopathy Screening Combo 05/15/2024 ---   Diabetes: Foot Exam 05/19/2024 05/20/2023, 05/24/2022 (Done)   Comment on 05/24/2022: See Legacy System   HIV Screening 09/15/2024 (Originally 06/27/1977) ---   Adult RSV (60+ Years or Pregnancy) (1 - Risk 60-74 years 1-dose series) 12/11/2024 (Originally 06/28/2019) ---   Pneumococcal Vaccine for Ages 50+ (2 of 2 - PCV) 12/11/2024 (Originally 08/25/2023) 08/24/2022   DTaP/Tdap/Td Vaccines (1 - Tdap) 12/11/2024 (Originally 06/27/1978) ---  COVID-19 Vaccine (6 - 2025-26 season) 03/24/2025 (Originally 03/16/2024) 05/11/2021, 12/26/2019   Comprehensive Annual Visit 09/15/2024 09/16/2023, 09/16/2023 (Done)   Cervical Cancer Screening 10/14/2024 10/15/2019, 10/15/2019   Lung Cancer Screening 11/03/2024 11/04/2023, 11/02/2022   Diabetes:  eGFR for Kidney Evaluation 12/16/2024 12/17/2023, 09/16/2023   Diabetes: Hemoglobin A1C 12/16/2024 12/17/2023, 12/17/2023   Breast Cancer Screening (Mammogram) 12/31/2024 01/01/2024, 12/29/2022   Depression Screening 01/30/2025 01/31/2024   Colorectal Cancer Screening 05/22/2032 05/22/2022, 05/22/2022         I, Elsie Favorite, PA-C, have reviewed the scribe's documentation, personally examined the patient, added my documentation and attest it is accurate and complete.   This document serves as a record of services personally performed by Elsie Favorite PA-C.  It was created on their behalf by Delon Crooked, CMA, a trained medical scribe, and Certified Medical Assistant (CMA). During the course of documenting the history, physical exam and medical decision making, I was functioning as a stage manager. The creation of this record is the provider's dictation and/or activities during the visit.  Electronically signed by Delon Crooked, CMA 04/21/2024 9:40 AM         [1] Current Outpatient Medications  Medication Sig Dispense Refill  . albuterol  HFA  (PROVENTIL  HFA;VENTOLIN  HFA;PROAIR  HFA) 90 mcg/actuation inhaler Inhale 2 puffs every 6 (six) hours as needed for wheezing. 8.5 g 1  . aspirin 81 mg EC tablet Take 81 mg by mouth.    . atorvastatin  (LIPITOR) 20 mg tablet Take 1 tablet (20 mg total) by mouth daily. 90 tablet 1  . blood-glucose meter misc 1 each by miscellaneous route Once Daily. 1 each 0  . Breztri Aerosphere 160-9-4.8 mcg/actuation inhaler INHALE 2 PUFFS BY MOUTH TWICE DAILY 10.7 g 3  . diclofenac sodium (VOLTAREN) 1 % gel APPLY 4 GRAMS TO TO THE AFFECTED AREA FOUR TIMES DAILY 100 g 5  . gabapentin (NEURONTIN) 100 mg capsule Take 1 capsule (100 mg total) by mouth 3 (three) times a day as needed (pain). 30 capsule 0  . glucose blood (Precision Q-I-D Test) test strip 1 each by miscellaneous route Once Daily. 100 each 1  . losartan (COZAAR) 25 mg tablet TAKE 1 TABLET(25 MG) BY MOUTH DAILY 30 tablet 3  . metFORMIN (GLUCOPHAGE-XR) 500 mg 24 hr tablet TAKE 1 TABLET(500 MG) BY MOUTH DAILY 90 tablet 1  . methocarbamoL (ROBAXIN) 500 mg tablet Take 1 tablet (500 mg total) by mouth 3 (three) times a day as needed for muscle spasms. 60 tablet 1  . omega 3-dha-epa-fish oil (OMEGA 3) 1,000 mg capsule Take 1 g by mouth Once Daily.    . celecoxib (CeleBREX) 200 mg capsule Take 1 capsule (200 mg total) by mouth daily. 30 capsule 1  . methylPREDNISolone (MEDROL DOSEPAK) 4 mg 6 day dose pack Take As Directed On Package 21 tablet 0   Current Facility-Administered Medications  Medication Dose Route Frequency Provider Last Rate Last Admin  . triamcinolone acetonide (KENALOG-40) 40 mg/mL injection 40 mg  40 mg intramuscular Once Elsie Fairy Favorite, PA-C      [2] Patient Active Problem List Diagnosis  . COPD with chronic bronchitis  . Smoking greater than 30 pack years  . B12 deficiency  . Pure hypercholesterolemia  . Obesity (BMI 30-39.9)  . Chronic swimmer's ear of both sides  . Type 2 diabetes mellitus without complications  . Severe  obesity (BMI 35.0-35.9 with comorbidity) (CMD)  . Severe obesity (BMI 35.0-39.9) with comorbidity (CMS/HCC)  [3] Past Surgical History: Procedure Laterality Date  .  HAND SURGERY     Procedure: HAND SURGERY  . TUBAL LIGATION    [4] Family History Problem Relation Name Age of Onset  . Breast cancer Mother  10  . Leukemia Mother    . Esophageal cancer Mother    . Bone cancer Mother    . Breast cancer Sister  29  . Heart disease Father         CAD/ CABG  . Aneurysm Maternal Grandmother    [5] Social History Socioeconomic History  . Marital status: Single  Tobacco Use  . Smoking status: Former    Current packs/day: 0.00    Average packs/day: 0.9 packs/day for 46.0 years (39.1 ttl pk-yrs)    Types: Cigarettes    Start date: 07/16/1973    Quit date: 07/17/2019    Years since quitting: 4.7    Passive exposure: Current  . Smokeless tobacco: Never  . Tobacco comments:     2025-per 2021 LS form/chart(0.85); varied hx up to 1ppd; quit in 2021  Substance and Sexual Activity  . Alcohol use: Not Currently  . Drug use: Never   Social Drivers of Health   Food Insecurity: Low Risk  (01/31/2024)   Food vital sign   . Within the past 12 months, you worried that your food would run out before you got money to buy more: Never true   . Within the past 12 months, the food you bought just didn't last and you didn't have money to get more: Never true  Transportation Needs: No Transportation Needs (01/31/2024)   Transportation   . In the past 12 months, has lack of reliable transportation kept you from medical appointments, meetings, work or from getting things needed for daily living? : No  Safety: Low Risk  (01/31/2024)   Safety   . How often does anyone, including family and friends, physically hurt you?: Never   . How often does anyone, including family and friends, insult or talk down to you?: Never   . How often does anyone, including family and friends, threaten you with harm?: Never   .  How often does anyone, including family and friends, scream or curse at you?: Never  Living Situation: Low Risk  (01/31/2024)   Living Situation   . What is your living situation today?: I have a steady place to live   . Think about the place you live. Do you have problems with any of the following? Choose all that apply:: None/None on this list  [6] Social History Tobacco Use  Smoking Status Former  . Current packs/day: 0.00  . Average packs/day: 0.9 packs/day for 46.0 years (39.1 ttl pk-yrs)  . Types: Cigarettes  . Start date: 07/16/1973  . Quit date: 07/17/2019  . Years since quitting: 4.7  . Passive exposure: Current  Smokeless Tobacco Never  Tobacco Comments    2025-per 2021 LS form/chart(0.85); varied hx up to 1ppd; quit in 2021

## 2024-04-21 NOTE — Telephone Encounter (Signed)
 Patient calling to request status of FMLA

## 2024-04-23 NOTE — Telephone Encounter (Signed)
 Patient was transferred to CMA [facility]

## 2024-04-23 NOTE — Telephone Encounter (Signed)
 Spoke with patient and she has an extended time to file her FMLA  papers I\until 05/05/2024

## 2024-04-29 NOTE — Progress Notes (Signed)
 [facility]  - Family Medicine [facility]  Date of Service: 04/30/2024 Patient Name: Angelica [name] Patient DOB: 03-04-1961    Subjective:   Back Pain   HPI Patient comes in for Back Pain  Subjective:    Angelica [name] is a 63 y.o. female who presents for follow up of low back problems. Current symptoms include: in the morning when stands up, pain from right hip all the way down her right leg.. Symptoms have not changed from the previous visit. Exacerbating factors identified by the patient are standing.   Seen originally in July.  Pain starts in right mid back and down right leg Took prednisone , then  had injection last visit on oct 7 Taking celebrex, at bedtime, pain was improving after had injection. Starting to flare again.  Worse pain when gets up and dressed. Does ok after gets mobility  Physical therapy scheduled. Has to change due to transportation.  Has been heat. Voltaren, stretching.  Is managing sx.   Not heard from spine specialist, given number.   Review of Systems Pertinent ROS items are noted in HPI.  Constitutional symptoms: negative Eyes:  negative Ear, nose, throat:  negative Cardiovascular:  negative Respiratory:  negative Gastrointestinal:  negative Genitourinary:  negative Skin:  negative Neurological: no n/t, no weakness Musculoskeletal:  back pain, has chronic knee and is supposed to have this replaced but can't right now with pain in back. Pain today 4/10 range in back Psychiatric:  negative Endocrine:  sugars have been overall ok  Hematological: has bruises on arms, discussed she is taking celebrex and was on steroid. She admits to taking asa and also ibuprofen in am. Adv to stop ibuprofen and use tylenol  arthritis in am. While on celebrex, decrease asa to 3 times a week. Monitor bruising. No nose bleeds, etc Allergic:  negative   The following portions of the patient's history were reviewed and updated  as appropriate: allergies, current medications, PMH/PSH, past social history and problem list.   Past Medical/Surgical History:   Medical History[1] Surgical History[2]  Family History:   Family History[3]  Social History:   Social History[4] Tobacco Use History[5]   Allergies:   Patient has no known allergies.  Current Medications:   Current Medications[6]   Objective:   Vital Signs BP 130/70 (BP Location: Right arm, Patient Position: Sitting)   Pulse 79   Temp 97.5 F (36.4 C) (Temporal)   Ht 1.499 m (4' 11)   Wt 87.6 kg (193 lb 2 oz)   SpO2 98%   BMI 39.01 kg/m   BP Readings from Last 3 Encounters:  04/30/24 130/70  04/21/24 136/82  03/24/24 140/64   Wt Readings from Last 3 Encounters:  04/30/24 87.6 kg (193 lb 2 oz)  04/21/24 86.5 kg (190 lb 9.6 oz)  03/24/24 88.2 kg (194 lb 8 oz)   No LMP recorded. Patient is postmenopausal.  Physical Exam  Constitutional.  Well appearing 63 y.o. female, well developed, well nourished, no acute distress. Respiratory.  Clear bilaterally, breathsounds equal, respirations unlabored. Cardiovascular.  Regular, nl S1, S2; no murmurs, gallops or rubs.  No lower extremity edema, 2+ peripheral pulses. Neuro: alert, oriented x 3, CN 2-12 intact bil, no neuro deficits.  Musculoskeletal: strength 2+ bil, requires assistance on to exam table. Able to flexion hip and some hyperextension of lumbar with mild pain. Tenderness right lumbar to buttock. No deformities. Normal ROM. No n/t of ext bil Skin: warm and dry Psych:  cooperative, pleasant.   Assessment/Plan:    Jazzlene was seen today for back pain.  Diagnoses and all orders for this visit:  Facet arthropathy, lumbar  Degeneration of intervertebral disc of lumbar region with discogenic back pain and lower extremity pain  Flu vaccine need -     Flu,Trivalent,IM, Preservative Free (FLULAVAL, (FLUZONE(SCOT)))  She will call spine center for appt Keep PT appt Cont heat/  stretching, ROM exercises Avoid ibuprofen/ asa when taking celebrex.  Call prn. Keep appt in nov as sch for DM fu   Patient verbalizes understanding and in agreement with the above plan. All questions answered.    Medication side effects discussed with patient. Advised patient to call clinic or return for visit if these symptoms occur.   Goals of care discussed with patient including med compliance and adequate follow up.  Return for as scheduled.    This document serves as a record of services personally performed by Santana Molt, FN.  It was created on their behalf by Seldon GORMAN Ann, CMA, a trained medical scribe, and Certified Medical Assistant (CMA). During the course of documenting the history, physical exam and medical decision making, I was functioning as a stage manager. The creation of this record is the provider's dictation and/or activities during the visit.  Electronically signed by Seldon GORMAN Ann, CMA 04/29/2024 4:43 PM    This document was created using the aid of voice recognition Dragon dictation software.   Santana Tarry Molt, FNP       [1] Past Medical History: Diagnosis Date  . Allergy   . Asthma   . COPD (chronic obstructive pulmonary disease)   . Current every day smoker 11/03/2018  . Hypertension   [2] Past Surgical History: Procedure Laterality Date  . HAND SURGERY     Procedure: HAND SURGERY  . TUBAL LIGATION    [3] Family History Problem Relation Name Age of Onset  . Breast cancer Mother  36  . Leukemia Mother    . Esophageal cancer Mother    . Bone cancer Mother    . Breast cancer Sister  1  . Heart disease Father         CAD/ CABG  . Aneurysm Maternal Grandmother    [4] Social History Socioeconomic History  . Marital status: Single  Tobacco Use  . Smoking status: Former    Current packs/day: 0.00    Average packs/day: 0.9 packs/day for 46.0 years (39.1 ttl pk-yrs)    Types: Cigarettes    Start date: 07/16/1973    Quit date:  07/17/2019    Years since quitting: 4.7    Passive exposure: Current  . Smokeless tobacco: Never  . Tobacco comments:     2025-per 2021 LS form/chart(0.85); varied hx up to 1ppd; quit in 2021  Substance and Sexual Activity  . Alcohol use: Not Currently  . Drug use: Never   Social Drivers of Health   Food Insecurity: Low Risk  (01/31/2024)   Food vital sign   . Within the past 12 months, you worried that your food would run out before you got money to buy more: Never true   . Within the past 12 months, the food you bought just didn't last and you didn't have money to get more: Never true  Transportation Needs: No Transportation Needs (01/31/2024)   Transportation   . In the past 12 months, has lack of reliable transportation kept you from medical appointments, meetings, work or from getting things needed for daily  living? : No  Safety: Low Risk  (01/31/2024)   Safety   . How often does anyone, including family and friends, physically hurt you?: Never   . How often does anyone, including family and friends, insult or talk down to you?: Never   . How often does anyone, including family and friends, threaten you with harm?: Never   . How often does anyone, including family and friends, scream or curse at you?: Never  Living Situation: Low Risk  (01/31/2024)   Living Situation   . What is your living situation today?: I have a steady place to live   . Think about the place you live. Do you have problems with any of the following? Choose all that apply:: None/None on this list  [5] Social History Tobacco Use  Smoking Status Former  . Current packs/day: 0.00  . Average packs/day: 0.9 packs/day for 46.0 years (39.1 ttl pk-yrs)  . Types: Cigarettes  . Start date: 07/16/1973  . Quit date: 07/17/2019  . Years since quitting: 4.7  . Passive exposure: Current  Smokeless Tobacco Never  Tobacco Comments    2025-per 2021 LS form/chart(0.85); varied hx up to 1ppd; quit in 2021  [6] Current  Outpatient Medications  Medication Sig Dispense Refill  . albuterol  HFA (PROVENTIL  HFA;VENTOLIN  HFA;PROAIR  HFA) 90 mcg/actuation inhaler Inhale 2 puffs every 6 (six) hours as needed for wheezing. 8.5 g 1  . aspirin 81 mg EC tablet Take 81 mg by mouth.    . atorvastatin  (LIPITOR) 20 mg tablet Take 1 tablet (20 mg total) by mouth daily. 90 tablet 1  . blood-glucose meter misc 1 each by miscellaneous route Once Daily. 1 each 0  . Breztri Aerosphere 160-9-4.8 mcg/actuation inhaler INHALE 2 PUFFS BY MOUTH TWICE DAILY 10.7 g 3  . celecoxib (CeleBREX) 200 mg capsule Take 1 capsule (200 mg total) by mouth daily. 30 capsule 1  . diclofenac sodium (VOLTAREN) 1 % gel APPLY 4 GRAMS TO TO THE AFFECTED AREA FOUR TIMES DAILY 100 g 5  . gabapentin (NEURONTIN) 100 mg capsule Take 1 capsule (100 mg total) by mouth 3 (three) times a day as needed (pain). 30 capsule 0  . glucose blood (Precision Q-I-D Test) test strip 1 each by miscellaneous route Once Daily. 100 each 1  . losartan (COZAAR) 25 mg tablet TAKE 1 TABLET(25 MG) BY MOUTH DAILY 30 tablet 3  . metFORMIN (GLUCOPHAGE-XR) 500 mg 24 hr tablet TAKE 1 TABLET(500 MG) BY MOUTH DAILY 90 tablet 1  . methocarbamoL (ROBAXIN) 500 mg tablet Take 1 tablet (500 mg total) by mouth 3 (three) times a day as needed for muscle spasms. 60 tablet 1  . methylPREDNISolone (MEDROL DOSEPAK) 4 mg 6 day dose pack Take As Directed On Package 21 tablet 0  . omega 3-dha-epa-fish oil (OMEGA 3) 1,000 mg capsule Take 1 g by mouth Once Daily.     No current facility-administered medications for this visit.

## 2024-05-15 ENCOUNTER — Emergency Department (HOSPITAL_COMMUNITY)
Admission: EM | Admit: 2024-05-15 | Discharge: 2024-05-15 | Disposition: A | Discharge status: Home / Self Care | Attending: Emergency Medicine | Admitting: Emergency Medicine

## 2024-05-15 ENCOUNTER — Emergency Department (HOSPITAL_COMMUNITY)

## 2024-05-15 ENCOUNTER — Other Ambulatory Visit: Payer: Self-pay

## 2024-05-15 DIAGNOSIS — Z7982 Long term (current) use of aspirin: Secondary | ICD-10-CM | POA: Insufficient documentation

## 2024-05-15 DIAGNOSIS — M1711 Unilateral primary osteoarthritis, right knee: Secondary | ICD-10-CM | POA: Insufficient documentation

## 2024-05-15 DIAGNOSIS — M1710 Unilateral primary osteoarthritis, unspecified knee: Secondary | ICD-10-CM

## 2024-05-15 MED ORDER — OXYCODONE-ACETAMINOPHEN 5-325 MG PO TABS
1.0000 | ORAL_TABLET | Freq: Once | ORAL | Status: AC
  Administered 2024-05-15: 1 via ORAL
  Filled 2024-05-15: qty 1

## 2024-05-15 MED ORDER — OXYCODONE-ACETAMINOPHEN 5-325 MG PO TABS: 1.0000 | ORAL_TABLET | Freq: Three times a day (TID) | ORAL | 0 refills | Status: DC | PRN

## 2024-05-15 MED ORDER — OXYCODONE-ACETAMINOPHEN 5-325 MG PO TABS: 1.0000 | ORAL_TABLET | Freq: Three times a day (TID) | ORAL | 0 refills | Status: AC | PRN

## 2024-05-15 NOTE — Discharge Instructions (Addendum)
 Please follow-up with the orthopedic surgeon as planned for additional care.

## 2024-05-15 NOTE — ED Triage Notes (Signed)
 Pt reports concern for worsening chronic knee pain. Sts this feels like her arthritis pain but its unbearable. Has taken ibuprofen and tylenol  with little to no relief. Last took ibuprofen at 5:15 am and tylenol  yesterday. No injury/trauma. Reports she is pending an ortho knee repair. Is ambulatory but pain with ambulation.

## 2024-05-15 NOTE — ED Provider Notes (Signed)
 [place] [facility]ERGENCY DEPARTMENT AT [facility] Provider Note   CSN: [id number] Arrival date & time: 05/15/24  0602     Patient presents with: Knee Pain (R)   Angelica [name] is a 63 y.o. female.   HPI    63 year old female comes in with chief complaint of knee pain.  Patient has known history of osteoarthritis.  She reports that she has been told that she will likely need surgery.  She has been deferring surgery until February.  However over the last few days, her pain has intensified.  She is having difficulty ambulating.  She has missed work.  She is taking ibuprofen with minimal relief.  There is increased swelling in her knee.  Pain is not radiating to her hip.  She has orthopedic appointment on Monday.  Prior to Admission medications   Medication Sig Start Date End Date Taking? Authorizing Provider  oxyCODONE-acetaminophen  (PERCOCET/ROXICET) 5-325 MG tablet Take 1 tablet by mouth every 8 (eight) hours as needed for severe pain (pain score 7-10). 05/15/24  Yes Charlyn Sora, MD  aspirin EC 81 MG tablet Take 81 mg by mouth daily.    [provider]  atorvastatin  (LIPITOR) 80 MG tablet TAKE ONE TABLET BY MOUTH ONCE DAILY 02/29/16   Court Dorn PARAS, MD  ezetimibe  (ZETIA ) 10 MG tablet TAKE ONE TABLET BY MOUTH ONCE DAILY 06/25/16   Court Dorn PARAS, MD  fenofibrate  160 MG tablet TAKE ONE TABLET BY MOUTH ONCE DAILY 06/25/16   Court Dorn PARAS, MD  fenofibrate  54 MG tablet Take 54 mg by mouth daily.    [provider]  fish oil-omega-3 fatty acids 1000 MG capsule Take 2 g by mouth daily.    [provider]  Ipratropium-Albuterol  (COMBIVENT RESPIMAT) 20-100 MCG/ACT AERS respimat Inhale 1 puff into the lungs every 6 (six) hours as needed. 04/04/16   Isadora Krabbe D, PA  metoprolol  succinate (TOPROL -XL) 25 MG 24 hr tablet Take 1 tablet (25 mg total) by mouth daily. 02/08/16   Court Dorn PARAS, MD  mometasone -formoterol  (DULERA) 100-5 MCG/ACT  AERO Inhale 2 puffs into the lungs 2 (two) times daily. 04/04/16   Isadora Krabbe D, PA    Allergies: Patient has no known allergies.    Review of Systems  All other systems reviewed and are negative.   Updated Vital Signs BP (!) 146/66 (BP Location: Left Arm)   Pulse 82   Temp 98.6 F (37 C) (Oral)   Resp 16   SpO2 97%   Physical Exam Vitals and nursing note reviewed.  Constitutional:      Appearance: She is well-developed.  HENT:     Head: Atraumatic.  Cardiovascular:     Rate and Rhythm: Normal rate.  Pulmonary:     Effort: Pulmonary effort is normal.  Musculoskeletal:        General: Swelling and tenderness present. No deformity.     Cervical back: Normal range of motion and neck supple.     Comments: Able to actively flex and extend over the knee, no erythema  Skin:    General: Skin is warm and dry.  Neurological:     Mental Status: She is alert and oriented to person, place, and time.     (all labs ordered are listed, but only abnormal results are displayed) Labs Reviewed - No data to display  EKG: None  Radiology: DG Knee Complete 4 Views Right Result Date: 05/15/2024 EXAM: 4 OR MORE VIEW(S) XRAY OF THE KNEE 05/15/2024 06:23:00  AM COMPARISON: None available. CLINICAL HISTORY: pain no injury FINDINGS: BONES AND JOINTS: No acute fracture. No focal osseous lesion. No joint dislocation. Moderate suprapatellar knee joint effusion. Moderate medial compartment osteoarthritis. Mild patellofemoral and lateral compartment osteoarthritis. 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 SOFT TISSUES: The soft tissues are unremarkable. IMPRESSION: 1. Moderate suprapatellar knee joint effusion. 2. 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 3. Moderate tricompartmental right knee osteoarthritis, most prominent in the medial compartment. Electronically signed by: Selinda Blue MD 05/15/2024 06:54 AM EDT RP Workstation:  [id number]     Procedures   Medications Ordered in the ED  oxyCODONE-acetaminophen  (PERCOCET/ROXICET) 5-325 MG per tablet 1 tablet (1 tablet Oral Given 05/15/24 0753)                                    Medical Decision Making Amount and/or Complexity of Data Reviewed Radiology: ordered.  Risk Prescription drug management.   63 year old female comes in with chief complaint of knee pain.  I have reviewed patient's previous records including [place]  controlled substance database.  On exam, she clearly has evidence of mild effusion.  I independently interpreted patient's x-ray of the knee, and it shows severe arthritis.  It does appear that patient likely has arthritis flareup.  It appears that she is supposed to get orthopedic evaluation on Monday, and has plans for knee replacement soon.  Right now she does not have any DME.  Ambulating even within her house or getting to the car has been difficult.  She has been missing work.  Ibuprofen, Tylenol  is not helping.  We will give her Percocet for the short-term.  I discussed with her that this will be a [employer].  Is possible that orthopedist might give her injection of the knee.  Ultimately, she will need knee replacement.  She is stable for discharge.  Final diagnoses:  Arthritis of knee    ED Discharge Orders          Ordered    oxyCODONE-acetaminophen  (PERCOCET/ROXICET) 5-325 MG tablet  Every 8 hours PRN        05/15/24 0800               Lillis Nuttle, MD 05/15/24 [phone]
